# Patient Record
Sex: Male | Born: 1940 | Race: White | Hispanic: No | Marital: Married | Smoking: Former smoker
Health system: Southern US, Community
[De-identification: ages and names within clinical notes are randomized; demographics above are authoritative.]

## PROBLEM LIST (undated history)

## (undated) DIAGNOSIS — K219 Gastro-esophageal reflux disease without esophagitis: Secondary | ICD-10-CM

## (undated) DIAGNOSIS — R0602 Shortness of breath: Secondary | ICD-10-CM

## (undated) DIAGNOSIS — R002 Palpitations: Secondary | ICD-10-CM

## (undated) DIAGNOSIS — R351 Nocturia: Secondary | ICD-10-CM

## (undated) DIAGNOSIS — I351 Nonrheumatic aortic (valve) insufficiency: Secondary | ICD-10-CM

## (undated) DIAGNOSIS — I2510 Atherosclerotic heart disease of native coronary artery without angina pectoris: Secondary | ICD-10-CM

## (undated) DIAGNOSIS — M89669 Osteopathy after poliomyelitis, unspecified lower leg: Secondary | ICD-10-CM

## (undated) DIAGNOSIS — I517 Cardiomegaly: Secondary | ICD-10-CM

## (undated) DIAGNOSIS — B91 Sequelae of poliomyelitis: Secondary | ICD-10-CM

## (undated) DIAGNOSIS — R112 Nausea with vomiting, unspecified: Secondary | ICD-10-CM

## (undated) DIAGNOSIS — Z953 Presence of xenogenic heart valve: Secondary | ICD-10-CM

## (undated) DIAGNOSIS — I340 Nonrheumatic mitral (valve) insufficiency: Secondary | ICD-10-CM

## (undated) DIAGNOSIS — G7000 Myasthenia gravis without (acute) exacerbation: Secondary | ICD-10-CM

## (undated) DIAGNOSIS — I10 Essential (primary) hypertension: Secondary | ICD-10-CM

## (undated) DIAGNOSIS — N401 Enlarged prostate with lower urinary tract symptoms: Secondary | ICD-10-CM

## (undated) DIAGNOSIS — I350 Nonrheumatic aortic (valve) stenosis: Secondary | ICD-10-CM

## (undated) DIAGNOSIS — Z9889 Other specified postprocedural states: Secondary | ICD-10-CM

## (undated) DIAGNOSIS — Z951 Presence of aortocoronary bypass graft: Secondary | ICD-10-CM

## (undated) HISTORY — DX: Nonrheumatic mitral (valve) insufficiency: I34.0

## (undated) HISTORY — DX: Essential (primary) hypertension: I10

## (undated) HISTORY — DX: Myasthenia gravis without (acute) exacerbation: G70.00

## (undated) HISTORY — DX: Sequelae of poliomyelitis: B91

## (undated) HISTORY — DX: Nonrheumatic aortic (valve) insufficiency: I35.1

## (undated) HISTORY — DX: Nocturia: R35.1

## (undated) HISTORY — PX: CATARACT EXTRACTION: SUR2

## (undated) HISTORY — DX: Palpitations: R00.2

## (undated) HISTORY — DX: Osteopathy after poliomyelitis, unspecified lower leg: M89.669

## (undated) HISTORY — PX: CARDIAC CATHETERIZATION: SHX172

## (undated) HISTORY — DX: Benign prostatic hyperplasia with lower urinary tract symptoms: N40.1

## (undated) HISTORY — DX: Cardiomegaly: I51.7

## (undated) HISTORY — DX: Nonrheumatic aortic (valve) stenosis: I35.0

---

## 2006-07-12 ENCOUNTER — Other Ambulatory Visit: Payer: Self-pay

## 2006-07-12 ENCOUNTER — Ambulatory Visit: Payer: Self-pay | Admitting: Ophthalmology

## 2006-07-20 ENCOUNTER — Ambulatory Visit: Payer: Self-pay | Admitting: Ophthalmology

## 2008-12-14 ENCOUNTER — Ambulatory Visit: Payer: Self-pay | Admitting: Ophthalmology

## 2008-12-25 ENCOUNTER — Ambulatory Visit: Payer: Self-pay | Admitting: Ophthalmology

## 2010-09-24 ENCOUNTER — Telehealth: Payer: Self-pay | Admitting: Cardiovascular Disease

## 2010-09-24 NOTE — Telephone Encounter (Signed)
Please call patient and let him know if records have been received for his visit on Friday.  Call his cell number at [phone]

## 2010-09-24 NOTE — Telephone Encounter (Signed)
Pt contacted, no MR found, he has echo, ekg and labs in hand. He will see if dr will fax office notes.

## 2010-09-26 ENCOUNTER — Ambulatory Visit (INDEPENDENT_AMBULATORY_CARE_PROVIDER_SITE_OTHER): Payer: Medicare Other | Admitting: Cardiovascular Disease

## 2010-09-26 ENCOUNTER — Encounter: Payer: Self-pay | Admitting: Cardiovascular Disease

## 2010-09-26 VITALS — BP 142/96 | HR 96 | Ht 68.0 in | Wt 187.6 lb

## 2010-09-26 DIAGNOSIS — I359 Nonrheumatic aortic valve disorder, unspecified: Secondary | ICD-10-CM

## 2010-09-26 DIAGNOSIS — I350 Nonrheumatic aortic (valve) stenosis: Secondary | ICD-10-CM | POA: Insufficient documentation

## 2010-09-26 NOTE — Progress Notes (Signed)
Stephen Mendoza Date of Birth  [birth year] [facility] / [facility] [address]     [address] [place], [place]  [address] [phone]  Fax  [phone]  History of Present Illness:  70 yo with a hx of Aortic stenosis.  He is seen today for a second opinion.  He presents today with symptoms of shortness of breath particularly with exercise. He would feel like he is breathing in cold air. He also noted episodes of shortness breath when he was digging post holes.  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  He's never had any syncope. He denies any chest pain. He states that he does not have the shortness breath all the time he exercises but doesn't frequently.  Current Outpatient Prescriptions  Medication Sig Dispense Refill  . Ascorbic Acid (VITAMIN C) 1000 MG tablet Take 1,000 mg by mouth daily.        Marland Kitchen aspirin 81 MG tablet Take 81 mg by mouth daily.        . Coenzyme Q10 (CO Q 10) 100 MG CAPS Take 100 mg by mouth daily.        Marland Kitchen GLYCINE PO Take by mouth. Glycine Propinyl-L-Carnitine HC1,  Taking 1,000 mg Daily       . losartan-hydrochlorothiazide (HYZAAR) 100-25 MG per tablet Take 1 tablet by mouth daily.        . Multiple Vitamin (MULTIVITAMIN) capsule Take 1 capsule by mouth daily.        . Omega-3 Fatty Acids (FISH OIL PO) Take 1,600 mg by mouth 2 (two) times daily.           No Known Allergies  Past Medical History  Diagnosis Date  . Hypertension   Aortic Stenosis  No past surgical history on file.  History  Smoking status  . Never Smoker   Smokeless tobacco  . Not on file    History  Alcohol Use: Not on file    No family history on file.  Reviw of Systems:  Reviewed in the HPI.  All other systems are negative.  Physical Exam: BP 142/96  Pulse 96  Ht 5\' 8"  (1.727 m)  Wt 187 lb 9.6 oz (85.095 kg)  BMI 28.52 kg/m2 The patient is alert and oriented x 3.  The mood and affect are normal.     Skin: warm and dry.  Color is normal.    HEENT:   the sclera are nonicteric.  The mucous membranes are moist.  The carotids are 2+ without bruits. There is radiation of the systolic murmur up into the carotids. There is no thyromegaly.  There is no JVD.    Lungs: clear.  The chest wall is non tender.    Heart: regular rate with a normal S1 and S2.  There is a 2-3/6 systolic murmur. The PMI is not displaced.     Abdomen: good bowel sounds.  There is no guarding or rebound.  There is no hepatosplenomegaly or tenderness.  There are no masses.   Extremities:  no clubbing, cyanosis, or edema.  The legs are without rashes.  The distal pulses are somewhat diminished consistent with aortic stenosis  Neuro:  Cranial nerves II - XII are intact.  Motor and sensory functions are intact.    The gait is normal.  ECG: Sinus bradycardia. 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

## 2010-09-26 NOTE — Assessment & Plan Note (Addendum)
I have had a long discussion with Stephen Mendoza and his wife. It is clear that he will need an aortic valve replacement. He certainly will also need a cardiac catheterization. His peak aortic valve gradient is around 99 mmHg with a mean aortic valve gradient of 53 mmHg  Fortunately he's not having any severe chest pain or syncope. He does have some shortness of breath. I've asked him to go ahead and try to find a good time to have his aortic valve replaced. He's to call me sooner she knows that day. We will set up a cardiac catheterization a week or so before that time. We'll also need to get a recording of the echocardiogram to get a good look at his aortic valve. If the aortic valve is truly anastomotic, I do not think that we need to cross the aortic valve during the heart catheterization.

## 2010-11-13 ENCOUNTER — Ambulatory Visit: Payer: Medicare Other | Admitting: Cardiovascular Disease

## 2010-11-28 ENCOUNTER — Ambulatory Visit (INDEPENDENT_AMBULATORY_CARE_PROVIDER_SITE_OTHER): Payer: Medicare Other | Admitting: Cardiovascular Disease

## 2010-11-28 ENCOUNTER — Encounter: Payer: Self-pay | Admitting: Cardiovascular Disease

## 2010-11-28 VITALS — BP 160/87 | HR 73 | Ht 68.0 in | Wt 184.8 lb

## 2010-11-28 DIAGNOSIS — I359 Nonrheumatic aortic valve disorder, unspecified: Secondary | ICD-10-CM

## 2010-11-28 DIAGNOSIS — I350 Nonrheumatic aortic (valve) stenosis: Secondary | ICD-10-CM

## 2010-11-28 NOTE — Progress Notes (Signed)
Stephen Mendoza Date of Birth  [birth year] [facility] [address]    [address] [place], [place]  [address] [phone]  Fax  [phone]  History of Present Illness:  Stephen Mendoza is a 70 y.o. gentleman with a Hx of severe aortic stenosis.  Last last saw him I have recommended that we proceed with cardiac catheterization at some point. He was asymptomatic at the time and we were going to review the actual echocardiogram. He requested that the echo disc be sent to [place] but it  never arrived. We have moved locations so there is a possibility that it went to our old office.  He remains asymptomatic. 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 Specifically he denies any chest pain, shortness breath, or syncope.  Current Outpatient Prescriptions on File Prior to Visit  Medication Sig Dispense Refill  . aspirin 81 MG tablet Take 81 mg by mouth daily.        . Coenzyme Q10 (CO Q 10) 100 MG CAPS Take 100 mg by mouth daily.        Marland Kitchen losartan-hydrochlorothiazide (HYZAAR) 100-25 MG per tablet Take 1 tablet by mouth daily.        . Multiple Vitamin (MULTIVITAMIN) capsule Take 1 capsule by mouth daily.        . Omega-3 Fatty Acids (FISH OIL PO) Take 1,600 mg by mouth 2 (two) times daily.          No Known Allergies  Past Medical History  Diagnosis Date  . Hypertension     Essential  . Aortic stenosis     severe  . Palpitations   . Chest pain   . Aortic regurgitation   . Mitral regurgitation     Mild moderate  . Left atrial enlargement     No past surgical history on file.  History  Smoking status  . Former Smoker  . Quit date: 10/24/1970  Smokeless tobacco  . Not on file    History  Alcohol Use No    Family History  Problem Relation Age of Onset  . Heart attack Father     Reviw of Systems:  Reviewed in the HPI.  All other systems are negative.  Physical Exam: BP 160/87   Pulse 73  Ht 5\' 8"  (1.727 m)  Wt 184 lb 12.8 oz (83.825 kg)  BMI 28.10 kg/m2 The patient is alert and oriented x 3.  The mood and affect are normal.   Skin: warm and dry.  Color is normal.    HEENT:   His carotids are 1-2+ and are slightly delayed. He has a radiated murmur into his carotid arteries. His neck is supple  Lungs: Lungs are clear.    Heart: Regular rate S1-S2. He has a 3/6 systolic murmur.  His PMI is nondisplaced.  Abdomen: He has good bowel sounds. There is no hepatosplenomegaly.  Extremities:  He has no clubbing cyanosis or edema. His peripheral pulses are somewhat diminished but are still full.  Neuro:  Nursing is non-focal. His gait is normal.     Assessment / Plan:

## 2010-11-28 NOTE — Assessment & Plan Note (Signed)
Stephen Mendoza we'll try one more time to have his echocardiogram sent to [place]. At this point he remains asymptomatic. I've told him about the 3 significant symptoms with aortic stenosis including chest pain, shortness of breath, and syncope. He will call us if he develops any B. symptoms. Otherwise I'll see him in 6 months.

## 2010-11-28 NOTE — Patient Instructions (Signed)
Your physician wants you to follow-up in: 6 MONTH You will receive a reminder letter in the mail two months in advance. If you don't receive a letter, please call our office to schedule the follow-up appointment.  BRING ACTUAL CD OF YOUR ECHO TO YOUR NEXT OFFICE VISIT.  WATCH FOR SYMPTOMS OF AORTIC STENOSIS CHEST PAIN SHORTNESS OF BREATH SYNCOPE (PASSING OUT)

## 2011-02-25 ENCOUNTER — Encounter: Payer: Self-pay | Admitting: Cardiovascular Disease

## 2011-05-28 ENCOUNTER — Other Ambulatory Visit: Payer: Self-pay

## 2011-05-28 ENCOUNTER — Ambulatory Visit (HOSPITAL_COMMUNITY): Payer: Medicare Other | Attending: Cardiology

## 2011-05-28 ENCOUNTER — Encounter: Payer: Self-pay | Admitting: Cardiovascular Disease

## 2011-05-28 ENCOUNTER — Ambulatory Visit (INDEPENDENT_AMBULATORY_CARE_PROVIDER_SITE_OTHER): Payer: Medicare Other | Admitting: Cardiovascular Disease

## 2011-05-28 VITALS — BP 155/82 | HR 67 | Ht 68.0 in | Wt 175.0 lb

## 2011-05-28 DIAGNOSIS — I359 Nonrheumatic aortic valve disorder, unspecified: Secondary | ICD-10-CM

## 2011-05-28 DIAGNOSIS — I10 Essential (primary) hypertension: Secondary | ICD-10-CM | POA: Insufficient documentation

## 2011-05-28 DIAGNOSIS — I350 Nonrheumatic aortic (valve) stenosis: Secondary | ICD-10-CM

## 2011-05-28 NOTE — Patient Instructions (Signed)
Your physician recommends that you schedule a follow-up appointment in: 1 month  Your physician has requested that you have an echocardiogram. Echocardiography is a painless test that uses sound waves to create images of your heart. It provides your doctor with information about the size and shape of your heart and how well your heart's chambers and valves are working. This procedure takes approximately one hour. There are no restrictions for this procedure.  Your physician has recommended you make the following change in your medication:  STOP LISINOPRIL/ DUPLICATE MED

## 2011-05-28 NOTE — Progress Notes (Signed)
  Stephen Mendoza Date of Birth  12/09/1940       [place] [facility]    [place] [facility] [address], [address]  [address] [place], [place]  [address]   [place], [place]  [address] [phone]     [phone]   Fax  [phone]    Fax [phone]  Problem List: 1. Aortic stenosis   History of Present Illness: Stephen Mendoza is a 71 y.o. gentleman with a Hx of severe aortic stenosis. Last last saw him I have recommended that we proceed with cardiac catheterization at some point. He was asymptomatic at the time and we were going to review the actual echocardiogram. He requested that the echo disc be sent to us but it never arrived. We have moved locations so there is a possibility that it went to our old office.  He remains asymptomatic. 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 Specifically he denies any chest pain, shortness breath, or syncope.  It is clear that he will need an aortic valve replacement. He certainly will also need a cardiac catheterization. His peak aortic valve gradient is around 99 mmHg with a mean aortic valve gradient of 53 mmHg.  He was started on lisinopril by his medical doctor. He was already on losartan. He questioned his doctor at that time but went ahead and took the medication. He brought with him his blood pressure log. His blood pressure readings are mildly elevated. His typical reading is around 140/75. He thinks that the lisinopril is also causing constipation.   Current Outpatient Prescriptions on File Prior to Visit  Medication Sig Dispense Refill  . aspirin 81 MG tablet Take 81 mg by mouth 2 (two) times daily.       . Coenzyme Q10 (CO Q 10) 100 MG CAPS Take 100 mg by mouth 2 (two) times daily.       . lisinopril (PRINIVIL,ZESTRIL) 10 MG tablet Take 10 mg by mouth daily.      . losartan-hydrochlorothiazide (HYZAAR) 100-25 MG per tablet Take 1  tablet by mouth daily.        . Multiple Vitamin (MULTIVITAMIN) capsule Take 1 capsule by mouth daily.        . Omega-3 Fatty Acids (FISH OIL PO) Take 1,600 mg by mouth 2 (two) times daily.          No Known Allergies  Past Medical History  Diagnosis Date  . Hypertension     Essential  . Aortic stenosis     severe  . Palpitations   . Chest pain   . Aortic regurgitation   . Mitral regurgitation     Mild moderate  . Left atrial enlargement     No past surgical history on file.  History  Smoking status  . Former Smoker  . Quit date: 10/24/1970  Smokeless tobacco  . Not on file    History  Alcohol Use No    Family History  Problem Relation Age of Onset  . Heart attack Father     Reviw of Systems:  Reviewed in the HPI.  All other systems are negative.  Physical Exam: Blood pressure 155/82, pulse 67, height 5' 8" (1.727 m), weight 175 lb (79.379 kg). General: Well developed, well nourished, in no acute distress.  Head: Normocephalic, atraumatic, sclera non-icteric, mucus membranes are moist,   Neck: Supple. Carotids are 2 + without bruits.   No JVD.  There is radiation of his systolic murmur. Lungs: Clear bilaterally to auscultation.  Heart: regular rate.  normal  S1 S2. There is a 3/6 crescendo decrescendo systolic murmur.  He said the upper right sternal border and into the carotids.  Abdomen: Soft, non-tender, non-distended with normal bowel sounds. No hepatomegaly. No rebound/guarding. No masses.  Msk:  Strength and tone are normal  Extremities: No clubbing or cyanosis. No edema.  Distal pedal pulses are 1+ and equal bilaterally.  Neuro: Alert and oriented X 3. Moves all extremities spontaneously.  Psych:  Responds to questions appropriately with a normal affect.  ECG:  Assessment / Plan:   

## 2011-05-28 NOTE — Assessment & Plan Note (Signed)
Stephen Mendoza has critical aortic stenosis. We will repeat the echocardiogram. His last echo revealed a mean aortic valve gradient of 53. I suspect that he will need to have his aortic valve replaced. Surprisingly, he is basically asymptomatic.  This summer would be a good time for him to have an aortic valve replacement according to his schedule.  He would like to have a minithoracotomy approach if possible.  We will get the echocardiogram and make further arrangements. I'll see him again in one month.

## 2011-06-02 ENCOUNTER — Telehealth: Payer: Self-pay | Admitting: Cardiovascular Disease

## 2011-06-02 NOTE — Telephone Encounter (Signed)
Pt calling re scheduling a catherization °

## 2011-06-04 ENCOUNTER — Telehealth: Payer: Self-pay | Admitting: *Deleted

## 2011-06-04 ENCOUNTER — Encounter: Payer: Self-pay | Admitting: *Deleted

## 2011-06-04 DIAGNOSIS — I350 Nonrheumatic aortic (valve) stenosis: Secondary | ICD-10-CM

## 2011-06-04 DIAGNOSIS — Z01818 Encounter for other preprocedural examination: Secondary | ICD-10-CM

## 2011-06-04 NOTE — Telephone Encounter (Signed)
Pre o[p lab work set, see letter.

## 2011-06-11 ENCOUNTER — Other Ambulatory Visit (INDEPENDENT_AMBULATORY_CARE_PROVIDER_SITE_OTHER): Payer: Medicare Other

## 2011-06-11 ENCOUNTER — Other Ambulatory Visit: Payer: Self-pay | Admitting: Cardiovascular Disease

## 2011-06-11 ENCOUNTER — Other Ambulatory Visit: Payer: Self-pay | Admitting: *Deleted

## 2011-06-11 ENCOUNTER — Telehealth: Payer: Self-pay | Admitting: *Deleted

## 2011-06-11 DIAGNOSIS — Z01818 Encounter for other preprocedural examination: Secondary | ICD-10-CM

## 2011-06-11 DIAGNOSIS — I359 Nonrheumatic aortic valve disorder, unspecified: Secondary | ICD-10-CM

## 2011-06-11 DIAGNOSIS — I350 Nonrheumatic aortic (valve) stenosis: Secondary | ICD-10-CM

## 2011-06-11 LAB — BASIC METABOLIC PANEL
CO2: 33 mEq/L — ABNORMAL HIGH (ref 19–32)
Calcium: 9.1 mg/dL (ref 8.4–10.5)
Creatinine, Ser: 0.7 mg/dL (ref 0.4–1.5)
Sodium: 144 mEq/L (ref 135–145)

## 2011-06-11 LAB — CBC WITH DIFFERENTIAL/PLATELET
Basophils Relative: 1 % (ref 0.0–3.0)
Eosinophils Relative: 3.8 % (ref 0.0–5.0)
Hemoglobin: 12.9 g/dL — ABNORMAL LOW (ref 13.0–17.0)
Lymphocytes Relative: 35.7 % (ref 12.0–46.0)
MCHC: 33.4 g/dL (ref 30.0–36.0)
Monocytes Relative: 8.8 % (ref 3.0–12.0)
Neutro Abs: 2.6 10*3/uL (ref 1.4–7.7)
Neutrophils Relative %: 50.7 % (ref 43.0–77.0)
RBC: 4.15 Mil/uL — ABNORMAL LOW (ref 4.22–5.81)
WBC: 5.1 10*3/uL (ref 4.5–10.5)

## 2011-06-11 LAB — PROTIME-INR: Prothrombin Time: 10.8 s (ref 10.2–12.4)

## 2011-06-11 LAB — APTT: aPTT: 28.4 s (ref 21.7–28.8)

## 2011-06-11 MED ORDER — AMLODIPINE BESYLATE 5 MG PO TABS: 5.0000 mg | ORAL_TABLET | Freq: Every day | ORAL | Status: DC

## 2011-06-11 NOTE — Telephone Encounter (Signed)
SEEN TODAY FOR LABS/ PAPERWORK GIVEN FOR L/RHC, DR NAHSER REVIEWED BP RESULTS AND STARTED NEW MED FOR HTN

## 2011-06-15 ENCOUNTER — Encounter (HOSPITAL_BASED_OUTPATIENT_CLINIC_OR_DEPARTMENT_OTHER)
Admission: RE | Disposition: A | Discharge status: Home / Self Care | Payer: Self-pay | Source: Ambulatory Visit | Attending: Cardiovascular Disease

## 2011-06-15 ENCOUNTER — Inpatient Hospital Stay (HOSPITAL_BASED_OUTPATIENT_CLINIC_OR_DEPARTMENT_OTHER)
Admission: RE | Admit: 2011-06-15 | Discharge: 2011-06-15 | Disposition: A | Discharge status: Home / Self Care | Payer: Medicare Other | Source: Ambulatory Visit | Attending: Cardiovascular Disease | Admitting: Cardiovascular Disease

## 2011-06-15 DIAGNOSIS — R0609 Other forms of dyspnea: Secondary | ICD-10-CM | POA: Insufficient documentation

## 2011-06-15 DIAGNOSIS — I359 Nonrheumatic aortic valve disorder, unspecified: Secondary | ICD-10-CM | POA: Insufficient documentation

## 2011-06-15 DIAGNOSIS — R0989 Other specified symptoms and signs involving the circulatory and respiratory systems: Secondary | ICD-10-CM | POA: Insufficient documentation

## 2011-06-15 DIAGNOSIS — I2510 Atherosclerotic heart disease of native coronary artery without angina pectoris: Secondary | ICD-10-CM

## 2011-06-15 LAB — POCT I-STAT 3, VENOUS BLOOD GAS (G3P V)
Acid-Base Excess: 4 mmol/L — ABNORMAL HIGH (ref 0.0–2.0)
Bicarbonate: 29.1 mEq/L — ABNORMAL HIGH (ref 20.0–24.0)
Bicarbonate: 30.1 mEq/L — ABNORMAL HIGH (ref 20.0–24.0)
O2 Saturation: 93 %
TCO2: 31 mmol/L (ref 0–100)
pCO2, Ven: 49.3 mmHg (ref 45.0–50.0)
pCO2, Ven: 51.7 mmHg — ABNORMAL HIGH (ref 45.0–50.0)
pO2, Ven: 38 mmHg (ref 30.0–45.0)
pO2, Ven: 70 mmHg — ABNORMAL HIGH (ref 30.0–45.0)

## 2011-06-15 LAB — POCT I-STAT 3, ART BLOOD GAS (G3+)
Bicarbonate: 30 mEq/L — ABNORMAL HIGH (ref 20.0–24.0)
pH, Arterial: 7.396 (ref 7.350–7.450)

## 2011-06-15 SURGERY — JV LEFT AND RIGHT HEART CATHETERIZATION WITH CORONARY ANGIOGRAM
Anesthesia: Moderate Sedation

## 2011-06-15 MED ORDER — SODIUM CHLORIDE 0.9 % IJ SOLN: 3.0000 mL | Freq: Two times a day (BID) | INTRAMUSCULAR | Status: DC

## 2011-06-15 MED ORDER — ACETAMINOPHEN 325 MG PO TABS: 650.0000 mg | ORAL_TABLET | ORAL | Status: DC | PRN

## 2011-06-15 MED ORDER — ASPIRIN 81 MG PO CHEW: 324.0000 mg | CHEWABLE_TABLET | ORAL | Status: DC

## 2011-06-15 MED ORDER — DIAZEPAM 5 MG PO TABS
5.0000 mg | ORAL_TABLET | ORAL | Status: AC
  Administered 2011-06-15: 5 mg via ORAL

## 2011-06-15 MED ORDER — ONDANSETRON HCL 4 MG/2ML IJ SOLN: 4.0000 mg | Freq: Four times a day (QID) | INTRAMUSCULAR | Status: DC | PRN

## 2011-06-15 MED ORDER — SODIUM CHLORIDE 0.9 % IV SOLN: 250.0000 mL | INTRAVENOUS | Status: DC | PRN

## 2011-06-15 MED ORDER — SODIUM CHLORIDE 0.9 % IJ SOLN: 3.0000 mL | INTRAMUSCULAR | Status: DC | PRN

## 2011-06-15 MED ORDER — SODIUM CHLORIDE 0.9 % IV SOLN
INTRAVENOUS | Status: DC
  Administered 2011-06-15: 11:00:00 via INTRAVENOUS

## 2011-06-15 NOTE — Interval H&P Note (Signed)
History and Physical Interval Note:  06/15/2011 12:33 PM  Stephen Mendoza  has presented today for surgery, with the diagnosis of cp  The various methods of treatment have been discussed with the patient and family. After consideration of risks, benefits and other options for treatment, the patient has consented to  Procedure(s) (LRB): JV LEFT AND RIGHT HEART CATHETERIZATION WITH CORONARY ANGIOGRAM (N/A) as a surgical intervention .  The patients' history has been reviewed, patient examined, no change in status, stable for surgery.  I have reviewed the patients' chart and labs.  Questions were answered to the patient's satisfaction.     Elyn Aquas.

## 2011-06-15 NOTE — H&P (View-Only) (Signed)
[name] Date of Birth  May 02, 1940       [facility]    [place] [address]  [address] [place], [place]  [address]   [place], [place]  [address] [phone]     [phone]   Fax  [phone]    Fax [phone]  Problem List: 1. Aortic stenosis   History of Present Illness: Waleed is a 71 y.o. gentleman with a Hx of severe aortic stenosis. Last last saw him I have recommended that we proceed with cardiac catheterization at some point. He was asymptomatic at the time and we were going to review the actual echocardiogram. He requested that the echo disc be sent to [place] but it never arrived. We have moved locations so there is a possibility that it went to our old office.  He remains asymptomatic. 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 Specifically he denies any chest pain, shortness breath, or syncope.  It is clear that he will need an aortic valve replacement. He certainly will also need a cardiac catheterization. His peak aortic valve gradient is around 99 mmHg with a mean aortic valve gradient of 53 mmHg.  He was started on lisinopril by his medical doctor. He was already on losartan. He questioned his doctor at that time but went ahead and took the medication. He brought with him his blood pressure log. His blood pressure readings are mildly elevated. His typical reading is around 140/75. He thinks that the lisinopril is also causing constipation.   Current Outpatient Prescriptions on File Prior to Visit  Medication Sig Dispense Refill  . aspirin 81 MG tablet Take 81 mg by mouth 2 (two) times daily.       . Coenzyme Q10 (CO Q 10) 100 MG CAPS Take 100 mg by mouth 2 (two) times daily.       Marland Kitchen lisinopril (PRINIVIL,ZESTRIL) 10 MG tablet Take 10 mg by mouth daily.      Marland Kitchen losartan-hydrochlorothiazide (HYZAAR) 100-25 MG per tablet Take 1  tablet by mouth daily.        . Multiple Vitamin (MULTIVITAMIN) capsule Take 1 capsule by mouth daily.        . Omega-3 Fatty Acids (FISH OIL PO) Take 1,600 mg by mouth 2 (two) times daily.          No Known Allergies  Past Medical History  Diagnosis Date  . Hypertension     Essential  . Aortic stenosis     severe  . Palpitations   . Chest pain   . Aortic regurgitation   . Mitral regurgitation     Mild moderate  . Left atrial enlargement     No past surgical history on file.  History  Smoking status  . Former Smoker  . Quit date: 10/24/1970  Smokeless tobacco  . Not on file    History  Alcohol Use No    Family History  Problem Relation Age of Onset  . Heart attack Father     Reviw of Systems:  Reviewed in the HPI.  All other systems are negative.  Physical Exam: Blood pressure 155/82, pulse 67, height 5\' 8"  (1.727 m), weight 175 lb (79.379 kg). General: Well developed, well nourished, in no acute distress.  Head: Normocephalic, atraumatic, sclera non-icteric, mucus membranes are moist,   Neck: Supple. Carotids are 2 + without bruits.  No JVD.  There is radiation of his systolic murmur. Lungs: Clear bilaterally to auscultation.  Heart: regular rate.  normal  S1 S2. There is a 3/6 crescendo decrescendo systolic murmur.  He said the upper right sternal border and into the carotids.  Abdomen: Soft, non-tender, non-distended with normal bowel sounds. No hepatomegaly. No rebound/guarding. No masses.  Msk:  Strength and tone are normal  Extremities: No clubbing or cyanosis. No edema.  Distal pedal pulses are 1+ and equal bilaterally.  Neuro: Alert and oriented X 3. Moves all extremities spontaneously.  Psych:  Responds to questions appropriately with a normal affect.  ECG:  Assessment / Plan:

## 2011-06-15 NOTE — Progress Notes (Signed)
Bedrest begins @ 1145.

## 2011-06-15 NOTE — Progress Notes (Signed)
Voided 300cc clear yellow urine

## 2011-06-15 NOTE — CV Procedure (Signed)
    Cardiac Cath Note  Stephen Mendoza [id number] 02/21/1940  Procedure: Right and Left Heart Cardiac Catheterization Note Indications: Aortic stenosis, dyspnea  Procedure Details Consent: Obtained Time Out: Verified patient identification, verified procedure, site/side was marked, verified correct patient position, special equipment/implants available, Radiology Safety Procedures followed,  medications/allergies/relevent history reviewed, required imaging and test results available.  Performed   Medications: Fentanyl: 50 mcg IV Versed: 2 mg IV  The right femoral artery and right femoral vein were easily canulated using a modified Seldinger technique.  Hemodynamics:   RA: 10/8/6 RV: 33/4/7 PCWP: 17/15/13 PA:  31/14/20  Cardiac Output   Thermodilution: 4.6 lpm, index of 2.4  Fick : 4.9 lpm , index of 2.5  Arterial Sat: 93 PA Sat: 65.  LV pressure: no obtained, did not cross the aortic valve Aortic pressure: 141/75/101  Angiography   Left Main: smooth and normal  Left anterior Descending: The LAD gives off an early 1st diagonal.  This 1st diagonal has a long diffuse 60-70% stenosis.  The mid LAD has a 50-60 segmental stenosis in the proximal segment.  The 2nd diagonal has a 20% stenosis at it's takeoff.  The distal LAD has a 20-30 % stenosis.  Left Circumflex: The LCx is a moderate - large vessel.  It gives off several moderate sized marginals that are fairly normal.  Right Coronary Artery: Large and dominant.  The prox / mid vessel has a long lesion that ranges from 50-60 % in severity.  The distal RCA has minor luminal irregularities.  The PDA and PLSA are normal.  LV Gram: no LV gram was performed  Complications: No apparent complications Patient did tolerate procedure well.  Conclusions:   1. 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 Known severe Aortic stenosis.  Will refer to the cardiac surgeons.  He will need CABG to LAD system and  RCA with his AVR.  Stephen Mendoza, Stephen Mendoza., MD, [facility] 06/15/2011, 11:33 AM Office - [phone] Pager [phone]

## 2011-06-16 ENCOUNTER — Telehealth: Payer: Self-pay | Admitting: Cardiovascular Disease

## 2011-06-16 NOTE — Telephone Encounter (Signed)
No tenderness/ swelling/ redness. Pt has some latent bruising 1 inch around access site. Told all signs were normal and described what to watch for and to call back with any questions or concerns, pt agreed to plan.

## 2011-06-16 NOTE — Telephone Encounter (Signed)
Pt hd heart cath yesterday and some discoloration at the site wants to discuss

## 2011-06-17 ENCOUNTER — Encounter: Payer: Medicare Other | Admitting: Thoracic Surgery (Cardiothoracic Vascular Surgery)

## 2011-06-18 ENCOUNTER — Encounter: Payer: Medicare Other | Admitting: Thoracic Surgery (Cardiothoracic Vascular Surgery)

## 2011-06-19 ENCOUNTER — Institutional Professional Consult (permissible substitution) (INDEPENDENT_AMBULATORY_CARE_PROVIDER_SITE_OTHER): Payer: Medicare Other | Admitting: Thoracic Surgery (Cardiothoracic Vascular Surgery)

## 2011-06-19 ENCOUNTER — Encounter: Payer: Self-pay | Admitting: Thoracic Surgery (Cardiothoracic Vascular Surgery)

## 2011-06-19 ENCOUNTER — Encounter: Payer: Medicare Other | Admitting: Thoracic Surgery (Cardiothoracic Vascular Surgery)

## 2011-06-19 VITALS — BP 157/87 | HR 75 | Resp 20 | Ht 68.0 in | Wt 173.0 lb

## 2011-06-19 DIAGNOSIS — R351 Nocturia: Secondary | ICD-10-CM

## 2011-06-19 DIAGNOSIS — N401 Enlarged prostate with lower urinary tract symptoms: Secondary | ICD-10-CM

## 2011-06-19 DIAGNOSIS — I359 Nonrheumatic aortic valve disorder, unspecified: Secondary | ICD-10-CM

## 2011-06-19 DIAGNOSIS — I350 Nonrheumatic aortic (valve) stenosis: Secondary | ICD-10-CM | POA: Insufficient documentation

## 2011-06-19 DIAGNOSIS — N138 Other obstructive and reflux uropathy: Secondary | ICD-10-CM

## 2011-06-19 DIAGNOSIS — I2510 Atherosclerotic heart disease of native coronary artery without angina pectoris: Secondary | ICD-10-CM | POA: Insufficient documentation

## 2011-06-19 NOTE — Progress Notes (Signed)
[address]            [name] [address]          [phone]     CARDIOTHORACIC SURGERY CONSULTATION REPORT  Referring Provider is Nahser, Deloris Ping, MD PCP is Judge Stall, MD, MD  Chief Complaint  Patient presents with  . Aortic Stenosis    referral from Dr Elease Hashimoto for eval on aortic stenosis and coronary artery disease, cardiac cath 06/15/11  . 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  The patient states that he was first told he had a heart murmur or than 40 years ago. He was first diagnosed with aortic stenosis approximately 4 years ago when his primary care physician referred him to a cardiologist in [place]. An echocardiogram was performed and the patient was told that he would someday require aortic valve replacement. At that time he remained asymptomatic.  2 years ago he first began to experience occasional episodes of chest discomfort associated with strenuous physical activity.  The patient describes these episodes as a feeling of cold sensation in his upper chest and throat, such as what might occur if one were to take a very deep breath of extremely cold air. He was seen at that time by a cardiologist in [place] and he was told that he should have aortic valve replacement surgery. He elected to hold off on any further evaluation at that time.  More recently the patient has developed progressive mild exertional shortness of breath and fatigue. Symptoms are always brought on with physical activity and relieved by rest. The patient's exercise tolerance is definitely decreased. He was referred to Dr. Elease Hashimoto and an echocardiogram was performed confirming the presence of severe aortic stenosis with normal left ventricular systolic function. The patient subsequently underwent left and right heart catheterization which also revealed the  presence of severe two-vessel coronary artery disease. The patient has now been referred to consider elective aortic valve replacement and coronary artery bypass grafting.   Past Medical History  Diagnosis Date  . Hypertension     Essential  . Aortic stenosis     severe  . Palpitations   . Chest pain   . Aortic regurgitation   . Mitral regurgitation     Mild moderate  . Left atrial enlargement     No past surgical history on file.  Family History  Problem Relation Age of Onset  . Heart attack Father     Social History History  Substance Use Topics  . Smoking status: Former Smoker    Quit date: 10/24/1970  . Smokeless tobacco: Not on file  . Alcohol Use: No    Current Outpatient Prescriptions  Medication Sig Dispense Refill  . amLODipine (NORVASC) 5 MG tablet Take 1 tablet (5 mg total) by mouth daily.  30 tablet  5  . aspirin 81 MG tablet Take 81 mg by mouth 2 (two) times daily.       . Coenzyme Q10 (CO Q 10) 100 MG CAPS Take 100 mg by mouth 2 (two) times daily.       Marland Kitchen GRAPE SEED EXTRACT PO Take by mouth daily.      Marland Kitchen HAWTHORN PO Take 300 mg by mouth 2 (two) times daily.      Marland Kitchen losartan-hydrochlorothiazide (HYZAAR) 100-25 MG  per tablet Take 1 tablet by mouth daily.        . Misc Natural Products (RED WINE EXTRACT PO) Take by mouth daily.      . Multiple Vitamin (MULTIVITAMIN) capsule Take 1 capsule by mouth daily.        . Omega-3 Fatty Acids (FISH OIL PO) Take 1,600 mg by mouth 2 (two) times daily.          No Known Allergies  Review of Systems:  General:  normal appetite, decreased energy   Respiratory:  no cough, no wheezing, no hemoptysis, no pain with inspiration or cough, + shortness of breath with exertion  Cardiac:   no chest pain or tightness, + exertional SOB, no resting SOB, no PND, no orthopnea, no LE edema, no palpitations, no syncope  GI:   + occasional mild difficulty swallowing, no hematochezia, no hematemesis, no melena, no constipation, no  diarrhea   GU:   no dysuria, no urgency, + frequency, + nocturia  Musculoskeletal: no arthritis, no arthralgia, slight limp related to right leg  Vascular:  no pain suggestive of claudication  Neuro:   no symptoms suggestive of TIA's, no seizures, no headaches, no peripheral neuropathy   Endocrine:  Negative   HEENT:  no loose teeth or painful teeth but hasn't seen a dentist in years,  no recent vision changes  Psych:   no anxiety, no depression    Physical Exam:   BP 157/87  Pulse 75  Resp 20  Ht 5\' 8"  (1.727 m)  Wt 173 lb (78.472 kg)  BMI 26.30 kg/m2  SpO2 98%  General:    well-appearing  HEENT:  Unremarkable   Neck:   no JVD, no bruits, no adenopathy   Chest:   clear to auscultation, symmetrical breath sounds, no wheezes, no rhonchi   CV:   RRR, grade III/VI systolic murmur   Abdomen:  soft, non-tender, no masses   Extremities:  warm, well-perfused, pulses diminished, no LE edema  Rectal/GU  Deferred  Neuro:   Grossly non-focal and symmetrical throughout  Skin:   Clean and dry, no rashes, no breakdown    Diagnostic Tests:  Transthoracic Echocardiography  Patient:    Stephen Mendoza, Stephen Mendoza MR #:       [id number] Study Date: 05/28/2011  ------------------------------------------------------------ LV EF: 60% -   65% ------------------------------------------------------------ Study Conclusions  - Left ventricle: The cavity size was normal. Wall thickness   was increased in a pattern of moderate LVH. Systolic   function was normal. The estimated ejection fraction was   in the range of 60% to 65%. Doppler parameters are   consistent with abnormal left ventricular relaxation   (grade 1 diastolic dysfunction). - Aortic valve: AV is difficult to see well. It is   thickened, calcified with restricted motion. Peak and mean   gradients through the valve are112 and 57 mm mm Hg   respectively consistent withcritical AS. Trivial   regurgitation. Mean gradient: 57mm Hg (S). Peak  gradient:   Hg (S). - Mitral valve: Mild regurgitation. - Pulmonary arteries: PA peak pressure: 34mm Hg (S). ---------------------------------- ------------------------------------------------------------ Left ventricle:  The cavity size was normal. Wall thickness was increased in a pattern of moderate LVH. Systolic function was normal. The estimated ejection fraction was in the range of 60% to 65%. Doppler parameters are consistent with abnormal left ventricular relaxation (grade 1 diastolic dysfunction). ------------------------------------------------------------ Aortic valve:  AV is difficult to see well. It is thickened, calcified with restricted motion. Peak and  mean gradients through the valve are112 and 57 mm mm Hg respectively consistent withcritical AS.  Doppler:   Trivial regurgitation.    VTI ratio of LVOT to aortic valve: 0.21. Valve area: 0.89cm^2(VTI). Indexed valve area: 0.44cm^2/m^2 (VTI). Peak velocity ratio of LVOT to aortic valve: 0.22. Valve area: 0.92cm^2 (Vmax). Indexed valve area: 0.46cm^2/m^2 (Vmax).    Mean gradient: 57mm Hg (S). Peak gradient: Hg (S). ------------------------------------------------------------ Mitral valve:   Mildly thickened leaflets .  Doppler:   Mild regurgitation.    Peak gradient: 2mm Hg (D). ------------------------------------------------------------ Left atrium:  The atrium was mildly dilated. ------------------------------------------------------------ Right ventricle:  The cavity size was normal. Wall thickness was normal. Systolic function was normal. ------------------------------------------------------------ Pulmonic valve:    Structurally normal valve.   Cusp separation was normal.  Doppler:  Transvalvular velocity was within the normal range.  No regurgitation. ------------------------------------------------------------ Tricuspid valve:   Structurally normal valve.   Leaflet separation was normal.  Doppler:   Transvalvular velocity was within the normal range.  No significant regurgitation.  ------------------------------------------------------------ Right atrium:  The atrium was normal in size. ------------------------------------------------------------ Pericardium:  There was no pericardial effusion. ------------------------------------------------------------ Systemic veins: Inferior vena cava: The vessel was normal in size; the respirophasic diameter changes were in the normal range (= 50%); findings are consistent with normal central venous pressure. ------------------------------------------------------------  2D measurements        Normal  Doppler measurements   Normal Left ventricle                 Main pulmonary LVID ED,   36.4 mm     43-52   artery chord,                         Pressure,    34 mm Hg  =30 PLAX                           S LVID ES,   17.7 mm     23-38   Left ventricle chord,                         Ea, lat    5.48 cm/s   ------ PLAX                           ann, tiss FS, chord,   51 %      >29     DP PLAX                           E/Ea, lat  13.8        ------ LVPW, ED   16.7 mm     ------  ann, tiss     7 IVS/LVPW   0.96        <1.3    DP ratio, ED                      Ea, med     5.7 cm/s   ------ Ventricular septum             ann, tiss IVS, ED    16.1 mm     ------  DP LVOT  E/Ea, med  13.3        ------ Diam, S      23 mm     ------  ann, tiss     3 Area       4.15 cm^2   ------  DP Diam         23 mm     ------  LVOT Aorta                          Peak vel,   117 cm/s   ------ Root diam,   30 mm     ------  S ED                             VTI, S     27.6 cm     ------ AAo AP       33 mm     ------  Peak          5 mm Hg  ------ diam, S                        gradient, Left atrium                    S AP dim       43 mm     ------  Stroke vol 114. ml     ------ AP dim     2.14 cm/m^2 <2.2                  7 index                           Stroke     57.1 ml/m^2 ------                                index                                Aortic valve                                Peak vel,   529 cm/s   ------                                S                                Mean vel,   346 cm/s   ------                                S                                VTI, S      129 cm     ------  Mean         57 mm Hg  ------                                gradient,                                S                                Peak        112 mm Hg  ------                                gradient,                                S                                VTI ratio  0.21        ------                                LVOT/AV                                Area, VTI  0.89 cm^2   ------                                Area index 0.44 cm^2/m ------                                (VTI)           ^2                                Peak vel   0.22        ------                                ratio,                                LVOT/AV                                Area, Vmax 0.92 cm^2   ------                                Area index 0.46 cm^2/m ------                                (Vmax)          ^  2                                Mitral valve                                Peak E vel   76 cm/s   ------                                Peak A vel 96.7 cm/s   ------                                Decelerati  197 ms     150-23                                on time                0                                Peak          2 mm Hg  ------                                gradient,                                D                                Peak E/A    0.8        ------                                ratio                                Tricuspid valve                                Regurg      271 cm/s   ------                                peak vel                                 Peak RV-RA   29 mm Hg  ------                                gradient,  S                                Systemic veins                                Estimated     5 mm Hg  ------                                CVP                                Right ventricle                                Pressure,    34 mm Hg  <30                                S                                Sa vel,    14.3 cm/s   ------                                lat ann,                                tiss DP   ------------------------------------------------------------ Prepared and Electronically Authenticated by  Dietrich Pates, MD 2013-05-16T17:15:23.517     Cardiac Cath Note  Stephen Mendoza [id number] 1940/01/17  Procedure: Right and Left Heart Cardiac Catheterization Note Indications: Aortic stenosis, dyspnea   Hemodynamics:    RA: 10/8/6 RV: 33/4/7 PCWP: 17/15/13 PA:  31/14/20  Cardiac Output              Thermodilution: 4.6 lpm, index of 2.4             Fick : 4.9 lpm , index of 2.5  Arterial Sat: 93 PA Sat: 65.  LV pressure: no obtained, did not cross the aortic valve Aortic pressure: 141/75/101  Angiography    Left Main: smooth and normal  Left anterior Descending: The LAD gives off an early 1st diagonal.  This 1st diagonal has a long diffuse 60-70% stenosis.  The mid LAD has a 50-60 segmental stenosis in the proximal segment.  The 2nd diagonal has a 20% stenosis at it's takeoff.  The distal LAD has a 20-30 % stenosis.  Left Circumflex: The LCx is a moderate - large vessel.  It gives off several moderate sized marginals that are fairly normal.  Right Coronary Artery: Large and dominant.  The prox / mid vessel has a long lesion that ranges from 50-60 % in severity.  The distal RCA has minor luminal irregularities.  The PDA and PLSA are normal.  LV Gram: no LV gram was performed    Impression:  The patient has severe  symptomatic aortic stenosis with severe two-vessel coronary artery disease and normal left ventricular systolic function. Patient has stable symptoms  of exertional shortness of breath and fatigue.  I agree that he needs aortic valve replacement and coronary artery bypass grafting.  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  He may require septal myomectomy at the time of aortic valve replacement. The patient otherwise is in remarkably good health and appears to be an excellent surgical candidate. He has not seen a dentist in several years although he does not report any ongoing symptoms worrisome for any sort of dental infection.   Plan:  The patient was counseled at length regarding surgical alternatives with respect to valve replacement. In particular, discussion was held comparing in contrast and the risks of mechanical valve replacement and the need for lifelong anticoagulation versus use of a bioprosthetic tissue valve and the associated potential for late structural valve deterioration in failure. Other alternatives including the Ross autograft procedure, homograft aortic root replacement, stentless bioprosthetic tissue valve replacement, valve repair, and transcatheter aortic valve replacement were discussed.  This discussion was placed in the context of the patient's particular circumstances, and as a result the patient specifically requests that their valve be replaced using a bioprosthetic tissue valve.  The patient and his wife are also counseled regarding the need for concomitant coronary artery bypass grafting do to the presence of significant proximal left anterior descending coronary artery and right coronary artery stenosis.  We discussed the presence of significant septal hypertrophy and the possibility that septal myomectomy might be  beneficial at the time of surgery.  They understand and accept all potential associated risks of surgery including but not limited to risk of death, stroke, myocardial infarction, congestive heart failure, respiratory failure, renal failure, pneumonia, bleeding requiring blood transfusion and or reexploration, arrhythmia, heart block or bradycardia requiring permanent pacemaker, pleural effusions or other delayed complications related to continued congestive heart failure, or other late complications related to valve replacement.  The patient desires to hold off for several weeks before proceeding with elective surgery. He has been counseled that there remains a very small but significant risk of acute myocardial infarction do to the presence of significant proximal coronary artery disease. In addition, the patient has been urged to seek consultation from the dental service for routine examination and dental cleaning prior to surgery since he has not seen a dentist in several years. We tentatively plan to proceed with surgery on Wednesday, July 10. The patient will return to the office for followup on Monday, July 8 prior to surgery.     Salvatore Decent. Cornelius Moras, MD 06/19/2011 2:20 PM

## 2011-06-22 ENCOUNTER — Other Ambulatory Visit: Payer: Self-pay | Admitting: Thoracic Surgery (Cardiothoracic Vascular Surgery)

## 2011-06-22 DIAGNOSIS — I2510 Atherosclerotic heart disease of native coronary artery without angina pectoris: Secondary | ICD-10-CM

## 2011-06-23 ENCOUNTER — Ambulatory Visit (HOSPITAL_COMMUNITY): Payer: Self-pay | Admitting: Dentistry

## 2011-06-23 ENCOUNTER — Other Ambulatory Visit: Payer: Self-pay

## 2011-06-23 ENCOUNTER — Encounter (HOSPITAL_COMMUNITY): Payer: Self-pay | Admitting: Dentistry

## 2011-06-23 VITALS — BP 142/80 | HR 70 | Temp 97.3°F

## 2011-06-23 DIAGNOSIS — I359 Nonrheumatic aortic valve disorder, unspecified: Secondary | ICD-10-CM

## 2011-06-23 DIAGNOSIS — K036 Deposits [accretions] on teeth: Secondary | ICD-10-CM

## 2011-06-23 DIAGNOSIS — I2510 Atherosclerotic heart disease of native coronary artery without angina pectoris: Secondary | ICD-10-CM

## 2011-06-23 DIAGNOSIS — M264 Malocclusion, unspecified: Secondary | ICD-10-CM

## 2011-06-23 DIAGNOSIS — K08109 Complete loss of teeth, unspecified cause, unspecified class: Secondary | ICD-10-CM

## 2011-06-23 DIAGNOSIS — K029 Dental caries, unspecified: Secondary | ICD-10-CM

## 2011-06-23 DIAGNOSIS — Z954 Presence of other heart-valve replacement: Secondary | ICD-10-CM

## 2011-06-23 DIAGNOSIS — K0530 Chronic periodontitis, unspecified: Secondary | ICD-10-CM

## 2011-06-23 MED ORDER — CHLORHEXIDINE GLUCONATE 0.12 % MT SOLN: OROMUCOSAL | Status: DC

## 2011-06-23 MED ORDER — AMOXICILLIN 500 MG PO CAPS: ORAL_CAPSULE | ORAL | Status: AC

## 2011-06-23 NOTE — Progress Notes (Signed)
DENTAL CONSULTATION  Date of Consultation:  06/23/2011 Patient Name:   Stephen Mendoza Date of Birth:   08-May-1940 Medical Record Number: [id number]  VITALS: BP 142/80  Pulse 70  Temp(Src) 97.3 F (36.3 C) (Oral)   CHIEF COMPLAINT: Patient referred for a pre-heart valve surgery dental protocol evaluation.  HPI: Stephen Mendoza is a 71 year old male referred by Dr. Tressie Stalker for a dental consultation. Patient with recent diagnosis of severe aortic stenosis and coronary artery disease.  Patient with anticipated aortic valve replacement along with a 2 vessel coronary artery bypass graft procedure. Patient is now seen as part of a pre-heart valve surgery dental protocol to rule out dental infection that  may affect the patient's systemic health and anticipated heart valve surgery.  The patient currently denies acute toothache, swellings, or abscesses. Patient was last seen approximately 5 years ago to have multiple extractions followed by subsequent fabrication of upper and lower partial dentures. Patient no longer wears the partial dentures and indicates that they "never really fit". Patient did not bring the partial dentures with him today. Patient indicates that his last dental cleaning was approximately 5 years ago as well. Patient last saw a Dr. Katrinka Blazing in [place], [place].  Patient Active Problem List  Diagnoses  . Aortic stenosis  . CAD (coronary artery disease)  . AS (aortic stenosis)  . Benign prostatic hypertrophy (BPH) with nocturia    PMH: Past Medical History  Diagnosis Date  . Hypertension     Essential  . Aortic stenosis     severe  . Palpitations   . Chest pain   . Aortic regurgitation   . Mitral regurgitation     Mild moderate  . Left atrial enlargement   . Polio osteopathy of lower leg   . Benign prostatic hypertrophy (BPH) with nocturia     PSH: Past Surgical History  Procedure Date  . Cataract extraction     ALLERGIES: No Known  Allergies  MEDICATIONS: Current Outpatient Prescriptions  Medication Sig Dispense Refill  . amLODipine (NORVASC) 5 MG tablet Take 1 tablet (5 mg total) by mouth daily.  30 tablet  5  . amoxicillin (AMOXIL) 500 MG capsule Take four capsules one hour before dental appointment.  4 capsule  2  . aspirin 81 MG tablet Take 81 mg by mouth 2 (two) times daily.       . chlorhexidine (PERIDEX) 0.12 % solution Rinse with 15 mls twice daily for 30 seconds. Use after breakfast and at bedtime. Spit out excess. Do not swallow.  480 mL  5  . Coenzyme Q10 (CO Q 10) 100 MG CAPS Take 100 mg by mouth 2 (two) times daily.       Marland Kitchen GRAPE SEED EXTRACT PO Take by mouth daily.      Marland Kitchen HAWTHORN PO Take 300 mg by mouth 2 (two) times daily.      Marland Kitchen losartan-hydrochlorothiazide (HYZAAR) 100-25 MG per tablet Take 1 tablet by mouth daily.        . Misc Natural Products (RED WINE EXTRACT PO) Take by mouth daily.      . Multiple Vitamin (MULTIVITAMIN) capsule Take 1 capsule by mouth daily.        . Omega-3 Fatty Acids (FISH OIL PO) Take 1,600 mg by mouth 2 (two) times daily.          LABS: Lab Results  Component Value Date   WBC 5.1 06/11/2011   HGB 12.9* 06/11/2011   HCT 38.7* 06/11/2011  MCV 93.4 06/11/2011   PLT 157.0 06/11/2011      Component Value Date/Time   NA 144 06/11/2011 1119   K 3.8 06/11/2011 1119   CL 103 06/11/2011 1119   CO2 33* 06/11/2011 1119   GLUCOSE 91 06/11/2011 1119   BUN 11 06/11/2011 1119   CREATININE 0.7 06/11/2011 1119   CALCIUM 9.1 06/11/2011 1119   Lab Results  Component Value Date   INR 1.0 06/11/2011   No results found for this basename: PTT    SOCIAL HISTORY: History   Social History  . Marital Status: Married    Spouse Name: N/A    Number of Children: N/A  . Years of Education: N/A   Occupational History  . Not on file.   Social History Main Topics  . Smoking status: Former Smoker    Quit date: 10/24/1970  . Smokeless tobacco: Not on file  . Alcohol Use: No  . Drug Use:  Not on file  . Sexually Active: Not on file   Other Topics Concern  . Not on file   Social History Narrative   Patient is married.Patient with a history of smoking a quarter pack a day for 5 years. Patient quit smoking on 10/24/1970. Patient has never used smokeless tobacco. Patient denies use of alcohol.    FAMILY HISTORY: Family History  Problem Relation Age of Onset  . Heart attack Father      REVIEW OF SYSTEMS: Reviewed with patient and positive as above.  DENTAL HISTORY: CHIEF COMPLAINT: Patient referred for a pre-heart valve surgery dental protocol evaluation.  HPI: Stephen Mendoza is a 71 year old male referred by Dr. Tressie Stalker for a dental consultation. Patient with recent diagnosis of severe aortic stenosis and coronary artery disease.  Patient with anticipated aortic valve replacement along with a 2 vessel coronary artery bypass graft procedure. Patient is now seen as part of a pre-heart valve surgery dental protocol to rule out dental infection that  may affect the patient's systemic health and anticipated heart valve surgery.  The patient currently denies acute toothache, swellings, or abscesses. Patient was last seen approximately 5 years ago to have multiple extractions followed by subsequent fabrication of upper and lower partial dentures. Patient no longer wears the partial dentures and indicates that they "never really fit". Patient did not bring a partial dentures with him today. Patient indicates that his last dental cleaning was approximately 5 years ago as well. Patient last saw a Dr. Katrinka Blazing in [place], [place].    DENTAL:  GENERAL: The patient is a well-developed, well-nourished male in no acute distress.  HEAD AND NECK: There is no palpable submandibular lymphadenopathy. The patient denies acute TMJ symptoms.  INTRAORAL EXAM:The patient has normal saliva. Patient has bilateral, multilobular mandibular lingual tori. There is no evidence of  abscess formation. DENTITION: The patient is missing tooth numbers 1, 2, 3, 4, 5, 13, 14, 16, 17, 18, 19, 29, 30, 31, and 32. PERIODONTAL:The patient has chronic periodontitis with plaque and calculus accumulations, generalized gingival recession and incipient mandibular anterior tooth mobility.  DENTAL CARIES/SUBOPTIMAL RESTORATIONS: The pateint has multiple dental caries and suboptimal dental restorations as per dental charting form.  ENDODONTIC: The patient denies acute pulpitis symptoms. There is no evidence of periapical pathology or radiolucency. CROWN AND BRIDGE: There are several crown restorations that appear to be acceptable. PROSTHODONTIC:The patient has a history of upper and lower partial dentures. Patient no longer wears the partial dentures however. OCCLUSION:The patient has a poor occlusal  scheme secondary to multiple missing teeth, and lack of replacement of the missing teeth with dental prostheses. Occlusion is stable however.  RADIOGRAPHIC INTERPRETATION: A Panoramic radiograph was taken along with 10 periapical radiographs. There are multiple missing teeth. There are multiple dental caries and suboptimal dental restorations noted. There is incipient to moderate bone loss. There is radiographic calculus noted. There is supra-eruption and drifting of the unopposed teeth into the edentulous areas. The are radiopaque ares consistent with mandibular tori.   ASSESSMENTS: 1. Chronic periodontitis with bone loss 2. Dental caries  3. Gingival recession. 4. Incipient tooth mobility of mandibular anterior teeth. 5. Bilateral mandibular lingual tori 6. History of ill fitting partial dentures 7. Supra-eruption and drifting of the unopposed teeth into the edentulous areas 8. Poor occlusal scheme but stable occlusion 9. Accretions   PLAN/RECOMMENDATIONS: 1. I discussed the risks, benefits, and complications of various treatment options with the patient in relationship to his medical  and dental conditions and anticipated heart valve surgery. We discussed various treatment options to include no treatment, pre-prosthetic surgery, periodontal therapy, dental restorations, root canal therapy, crown and bridge therapy, implant therapy, and replacement of missing teeth as indicated. The patient currently wishes to proceed with gross debridement of teeth with restorations with antibiotic premedication as per discussion with Dr. Camillo Flaming. The patient will then followup with a primary dentist of his choice for routine periodontal maintenance and evaluation of other treatment options at that time. The patient was provided a prescription for chlorhexidine rinse to sue twice a day for the next three to four months and amoxicillin for premedication use.   2. Discussion of findings with medical team and coordination of future medical and dental care.   Charlynne Pander, DDS

## 2011-06-29 ENCOUNTER — Encounter (HOSPITAL_COMMUNITY): Payer: Self-pay | Admitting: Dentistry

## 2011-06-29 ENCOUNTER — Ambulatory Visit (HOSPITAL_COMMUNITY): Payer: Self-pay | Admitting: Dentistry

## 2011-06-29 VITALS — BP 133/72 | HR 68 | Temp 97.8°F

## 2011-06-29 DIAGNOSIS — K036 Deposits [accretions] on teeth: Secondary | ICD-10-CM

## 2011-06-29 DIAGNOSIS — K0530 Chronic periodontitis, unspecified: Secondary | ICD-10-CM

## 2011-06-29 DIAGNOSIS — K029 Dental caries, unspecified: Secondary | ICD-10-CM

## 2011-06-29 NOTE — Progress Notes (Signed)
Monday, June 29, 2011   BP: 133/72       P: 68        T: 97.8  Stephen Mendoza presents for dental procedures. Patient took amoxicillin 2.0 grams one hour before the procedure. Antibiotic verification form completed. Patient was administered oxygen via nasal canula for the hour long procedure. O2 saturations kept at 99-100 percent. Procedures: 36 Xylocaine with epi vial infiltration #8-10. Z6109 #8 MIFL/Resin and DFL/Resin AE, Primer, DBA, Tetric Ceram EVO Shade A 2.0. Contour and polish. Removed overhangs from previous restorations on the mesial resin #10 and the distal resin #8. [place]. Periapical radiograph taken to verify acceptable margins. U0454. Gross debridement of remaining dentition with KaVo sonic scaler and selective hand curettes. [place]. Floss. Patient tolerated procedure well. Patient dismissed in stable condition.   Dr. Cindra Eves

## 2011-07-07 ENCOUNTER — Encounter: Payer: Self-pay | Admitting: Cardiovascular Disease

## 2011-07-07 ENCOUNTER — Encounter (HOSPITAL_COMMUNITY): Payer: Self-pay | Admitting: Dentistry

## 2011-07-07 ENCOUNTER — Ambulatory Visit (HOSPITAL_COMMUNITY): Payer: Self-pay | Admitting: Dentistry

## 2011-07-07 ENCOUNTER — Telehealth: Payer: Self-pay | Admitting: Cardiovascular Disease

## 2011-07-07 VITALS — BP 135/76 | HR 60 | Temp 97.9°F

## 2011-07-07 DIAGNOSIS — K029 Dental caries, unspecified: Secondary | ICD-10-CM

## 2011-07-07 DIAGNOSIS — K030 Excessive attrition of teeth: Secondary | ICD-10-CM

## 2011-07-07 MED ORDER — DOXAZOSIN MESYLATE 4 MG PO TABS: 4.0000 mg | ORAL_TABLET | Freq: Every day | ORAL | Status: DC

## 2011-07-07 NOTE — Telephone Encounter (Signed)
No answer at home number/ rings-no answer.  C/o  side effect from amlodipine being eye pain and a discomfort. Remembers that side effect when tried norvasc prior.

## 2011-07-07 NOTE — Telephone Encounter (Signed)
Pt to stop amlodipine start cardura 4mg , pt agreed to plan.

## 2011-07-07 NOTE — Telephone Encounter (Signed)
Pt wants to discuss a problem he is having with a medication and he wants to make sure he is to keep the appt that is scheduled right now

## 2011-07-07 NOTE — Progress Notes (Signed)
07/07/2011  Patient:            Stephen Mendoza Date of Birth:  [birth year] MRN:                [id number]  BP 135/76  Pulse 60  Temp 97.9 F (36.6 C)  Mardene Sayer presents for dental procedures. Patient took amoxicillin 2.0 grams one hour before the procedure. Antibiotic verification form completed. Procedures: 30 second chlorhexidine rinse. 36 Xylocaine with epi via infiltration #6 and mental nerve block lower right. N8295 #6  ML/Resin  AE, Primer, DBA, Tetric Ceram EVO Shade A 2.0. Contour and polish. A2130 #27  IFL/Resin  AE, Primer, DBA, Tetric Ceram EVO Shade A 1.0. Contour and polish. Q6578 #28 DO/Resin  AE, Primer, DBA, Tetric Ceram EVO Shade A 1.0. Contour and polish. Occlusion checked and adjusted as needed. Patient accepts results. Patient tolerated procedure well. Patient is now cleared for heart valve surgery with Dr. Cornelius Moras. Will assist in finding primary dentist as needed. Patient understands that he will need antibiotic premedication prior to invasive dental procedures once heart valve surgery is performed. Patient was dismissed in stable condition.  Dr. Cindra Eves.

## 2011-07-08 ENCOUNTER — Encounter (HOSPITAL_COMMUNITY): Payer: Self-pay | Admitting: Respiratory Therapy

## 2011-07-09 ENCOUNTER — Ambulatory Visit: Payer: Medicare Other | Admitting: Cardiovascular Disease

## 2011-07-09 ENCOUNTER — Other Ambulatory Visit (HOSPITAL_COMMUNITY): Payer: Medicare Other

## 2011-07-09 ENCOUNTER — Inpatient Hospital Stay (HOSPITAL_COMMUNITY): Admission: RE | Admit: 2011-07-09 | Discharge: 2011-07-09 | Payer: Medicare Other | Source: Ambulatory Visit

## 2011-07-09 ENCOUNTER — Encounter (HOSPITAL_COMMUNITY): Payer: Self-pay

## 2011-07-09 HISTORY — DX: Other specified postprocedural states: Z98.890

## 2011-07-09 HISTORY — DX: Shortness of breath: R06.02

## 2011-07-09 HISTORY — DX: Nausea with vomiting, unspecified: R11.2

## 2011-07-09 NOTE — Pre-Procedure Instructions (Signed)
20 Stephen Mendoza  07/09/2011   Your procedure is scheduled on:  **07/22/11*  Report to [facility] at 630 AM.  Call this number if you have problems the morning of surgery: [phone]   Remember:   Do not eat food:After Midnight.  May have clear liquids:until Midnight .  Clear liquids include soda, tea, black coffee, apple or grape juice, broth.  Take these medicines the morning of surgery with A SIP OF WATER: cardura   Do not wear jewelry, make-up or nail polish.  Do not wear lotions, powders, or perfumes. You may wear deodorant.  Do not shave 48 hours prior to surgery. Men may shave face and neck.  Do not bring valuables to the hospital.  Contacts, dentures or bridgework may not be worn into surgery.  Leave suitcase in the car. After surgery it may be brought to your room.  For patients admitted to the hospital, checkout time is 11:00 AM the day of discharge.   Patients discharged the day of surgery will not be allowed to drive home.  Name and phone number of your driver: family  Special Instructions: CHG Shower Use Special Wash: 1/2 bottle night before surgery and 1/2 bottle morning of surgery.   Please read over the following fact sheets that you were given: Pain Booklet, Coughing and Deep Breathing, Blood Transfusion Information, MRSA Information and Surgical Site Infection Prevention

## 2011-07-20 ENCOUNTER — Ambulatory Visit (INDEPENDENT_AMBULATORY_CARE_PROVIDER_SITE_OTHER): Payer: Medicare Other | Admitting: Thoracic Surgery (Cardiothoracic Vascular Surgery)

## 2011-07-20 ENCOUNTER — Encounter (HOSPITAL_COMMUNITY)
Admission: RE | Admit: 2011-07-20 | Discharge: 2011-07-20 | Disposition: A | Discharge status: Home / Self Care | Payer: Medicare Other | Source: Ambulatory Visit | Attending: Thoracic Surgery (Cardiothoracic Vascular Surgery) | Admitting: Thoracic Surgery (Cardiothoracic Vascular Surgery)

## 2011-07-20 ENCOUNTER — Encounter: Payer: Self-pay | Admitting: Thoracic Surgery (Cardiothoracic Vascular Surgery)

## 2011-07-20 ENCOUNTER — Ambulatory Visit: Payer: Medicare Other | Admitting: Thoracic Surgery (Cardiothoracic Vascular Surgery)

## 2011-07-20 ENCOUNTER — Ambulatory Visit (HOSPITAL_COMMUNITY)
Admission: RE | Admit: 2011-07-20 | Discharge: 2011-07-20 | Disposition: A | Discharge status: Home / Self Care | Payer: Medicare Other | Source: Ambulatory Visit | Attending: Thoracic Surgery (Cardiothoracic Vascular Surgery) | Admitting: Thoracic Surgery (Cardiothoracic Vascular Surgery)

## 2011-07-20 VITALS — BP 171/79 | HR 50 | Temp 97.1°F | Resp 18 | Ht 68.0 in | Wt 174.4 lb

## 2011-07-20 VITALS — BP 170/80 | HR 56 | Resp 18 | Ht 68.0 in | Wt 176.0 lb

## 2011-07-20 DIAGNOSIS — I2510 Atherosclerotic heart disease of native coronary artery without angina pectoris: Secondary | ICD-10-CM

## 2011-07-20 DIAGNOSIS — R351 Nocturia: Secondary | ICD-10-CM

## 2011-07-20 DIAGNOSIS — N401 Enlarged prostate with lower urinary tract symptoms: Secondary | ICD-10-CM

## 2011-07-20 DIAGNOSIS — I359 Nonrheumatic aortic valve disorder, unspecified: Secondary | ICD-10-CM

## 2011-07-20 DIAGNOSIS — Z01810 Encounter for preprocedural cardiovascular examination: Secondary | ICD-10-CM

## 2011-07-20 DIAGNOSIS — I350 Nonrheumatic aortic (valve) stenosis: Secondary | ICD-10-CM

## 2011-07-20 DIAGNOSIS — Z01812 Encounter for preprocedural laboratory examination: Secondary | ICD-10-CM | POA: Insufficient documentation

## 2011-07-20 DIAGNOSIS — N138 Other obstructive and reflux uropathy: Secondary | ICD-10-CM

## 2011-07-20 LAB — COMPREHENSIVE METABOLIC PANEL
AST: 26 U/L (ref 0–37)
BUN: 10 mg/dL (ref 6–23)
CO2: 26 mEq/L (ref 19–32)
Calcium: 9.7 mg/dL (ref 8.4–10.5)
Chloride: 103 mEq/L (ref 96–112)
Creatinine, Ser: 0.64 mg/dL (ref 0.50–1.35)
GFR calc Af Amer: >90 mL/min (ref >90)
GFR calc non Af Amer: >90 mL/min (ref >90)
Glucose, Bld: 109 mg/dL — ABNORMAL HIGH (ref 70–99)
Total Bilirubin: 0.3 mg/dL (ref 0.3–1.2)

## 2011-07-20 LAB — CBC
HCT: 39.8 % (ref 39.0–52.0)
Hemoglobin: 13.6 g/dL (ref 13.0–17.0)
MCH: 31.2 pg (ref 26.0–34.0)
MCV: 91.3 fL (ref 78.0–100.0)
RBC: 4.36 MIL/uL (ref 4.22–5.81)
WBC: 6 10*3/uL (ref 4.0–10.5)

## 2011-07-20 LAB — URINALYSIS, ROUTINE W REFLEX MICROSCOPIC
Bilirubin Urine: NEGATIVE
Hgb urine dipstick: NEGATIVE
Nitrite: NEGATIVE
Specific Gravity, Urine: 1.012 (ref 1.005–1.030)
Urobilinogen, UA: 0.2 mg/dL (ref 0.0–1.0)
pH: 6.5 (ref 5.0–8.0)

## 2011-07-20 LAB — BLOOD GAS, ARTERIAL
Acid-Base Excess: 3.9 mmol/L — ABNORMAL HIGH (ref 0.0–2.0)
Bicarbonate: 28 mEq/L — ABNORMAL HIGH (ref 20.0–24.0)
Drawn by: 206361
O2 Saturation: 95.4 %
TCO2: 29.3 mmol/L (ref 0–100)
pCO2 arterial: 42.8 mmHg (ref 35.0–45.0)
pO2, Arterial: 77.3 mmHg — ABNORMAL LOW (ref 80.0–100.0)

## 2011-07-20 LAB — PROTIME-INR: Prothrombin Time: 13.1 seconds (ref 11.6–15.2)

## 2011-07-20 LAB — PULMONARY FUNCTION TEST

## 2011-07-20 LAB — APTT: aPTT: 33 seconds (ref 24–37)

## 2011-07-20 MED ORDER — CHLORHEXIDINE GLUCONATE 4 % EX LIQD: 30.0000 mL | CUTANEOUS | Status: DC

## 2011-07-20 NOTE — Pre-Procedure Instructions (Signed)
20 ZACKARIE CHASON  07/20/2011   Your procedure is scheduled on:  Wed, July 10 @ 8:30 AM  Report to [facility] at 6:30 AM.  Call this number if you have problems the morning of surgery: [phone]   Remember:   Do not eat food:After Midnight.     Do not wear jewelry  Do not wear lotions, powders, or cologne  Men may shave face and neck.  Do not bring valuables to the hospital.  Contacts, dentures or bridgework may not be worn into surgery.  Leave suitcase in the car. After surgery it may be brought to your room.  For patients admitted to the hospital, checkout time is 11:00 AM the day of discharge.   Patients discharged the day of surgery will not be allowed to drive home.  Special Instructions: Incentive Spirometry - Practice and bring it with you on the day of surgery. and CHG Shower Use Special Wash: 1/2 bottle night before surgery and 1/2 bottle morning of surgery.   Please read over the following fact sheets that you were given: Pain Booklet, Coughing and Deep Breathing, Blood Transfusion Information, Open Heart Packet, MRSA Information and Surgical Site Infection Prevention

## 2011-07-20 NOTE — Progress Notes (Signed)
VASCULAR LAB PRELIMINARY  PRELIMINARY  PRELIMINARY  PRELIMINARY  Pre-op Cardiac Surgery  Carotid Findings:  No evidence of significant ICA stenosis and vertebral artery flow is antegrade bilaterally.    Upper Extremity Right Left  Brachial Pressures 172 T 169 T  Radial Waveforms T T  Ulnar Waveforms T T  Palmar Arch (Allen's Test) * *   Findings:  *Doppler waveforms remain normal with ulnar and radial compressions bilaterally.      Lower  Extremity Right Left  Dorsalis Pedis    Anterior Tibial    Posterior Tibial    Ankle/Brachial Indices      Findings:  Palpable pedal pulses x 4.     Stephen Mendoza, 07/20/2011, 12:46 PM

## 2011-07-20 NOTE — Progress Notes (Signed)
                   [address]            [name] [address]          [phone]     CARDIOTHORACIC SURGERY OFFICE NOTE  Referring Provider is Nahser, Deloris Ping, MD PCP is Judge Stall, MD   HPI:  Patient returns for follow up of severe aortic stenosis and coronary artery disease.  He was originally seen in consultation on 06/19/2011.  Since then he has been seen in consultation by Dr Robin Searing and cleared for surgery.  He reports no new problems except for a sensation of having a difficult time keeping his eyes open since he resumed taking amlodipine.  He stopped amlodipine 1 1/2 weeks ago and seems some better.  He otherwise has no complaints.   Current Outpatient Prescriptions  Medication Sig Dispense Refill  . aspirin 81 MG tablet Take 81 mg by mouth 2 (two) times daily.       . Coenzyme Q10 (CO Q 10) 100 MG CAPS Take 100 mg by mouth 2 (two) times daily.       Marland Kitchen HAWTHORNE PO Take 300 mg by mouth 2 (two) times daily.      Marland Kitchen losartan-hydrochlorothiazide (HYZAAR) 100-25 MG per tablet Take 1 tablet by mouth daily.        . Multiple Vitamin (MULTIVITAMIN) capsule Take 1 capsule by mouth daily.        . Omega-3 Fatty Acids (FISH OIL PO) Take 1,600 mg by mouth 2 (two) times daily.        Marland Kitchen pyridOXINE (B-6) 50 MG tablet Take 50 mg by mouth daily.          Physical Exam:   BP 170/80  Pulse 56  Resp 18  Ht 5\' 8"  (1.727 m)  Wt 176 lb (79.833 kg)  BMI 26.76 kg/m2  SpO2 98%  General:  Well appearing  Chest:   clear  CV:   RRR  With systolic murmur  Incisions:  n/a  Abdomen:  soft  Extremities:  warm  Diagnostic Tests:  n/a   Impression:  Severe symptomatic aortic stenosis with severe LVH and possible LVOT obstruction plus 2-vessel CAD  Plan:  We plan aortic valve replacement and coronary artery bypass grafting with possible septal myomectomy on Wednesday 07/22/2011.  I again reviewed the indications, risks and potential benefits of surgery.  Mr Sachse  specifically requests that his valve be replaced using a bioprosthetic tissue valve.  All questions answered.   Salvatore Decent. Cornelius Moras, MD 07/20/2011 10:34 AM

## 2011-07-20 NOTE — Progress Notes (Signed)
Confirmed that PFT's and Dopplers were done today July 8th

## 2011-07-21 LAB — HEMOGLOBIN A1C: Mean Plasma Glucose: 117 mg/dL — ABNORMAL HIGH (ref <117)

## 2011-07-21 MED ORDER — MAGNESIUM SULFATE 50 % IJ SOLN
40.0000 meq | INTRAMUSCULAR | Status: DC
  Filled 2011-07-21: qty 10

## 2011-07-21 MED ORDER — DEXTROSE 5 % IV SOLN
750.0000 mg | INTRAVENOUS | Status: DC
  Filled 2011-07-21: qty 750

## 2011-07-21 MED ORDER — PHENYLEPHRINE HCL 10 MG/ML IJ SOLN
30.0000 ug/min | INTRAVENOUS | Status: AC
  Administered 2011-07-22: 10 ug via INTRAVENOUS
  Filled 2011-07-21: qty 2

## 2011-07-21 MED ORDER — TRANEXAMIC ACID (OHS) PUMP PRIME SOLUTION
2.0000 mg/kg | INTRAVENOUS | Status: DC
  Filled 2011-07-21: qty 1.58

## 2011-07-21 MED ORDER — NITROGLYCERIN IN D5W 200-5 MCG/ML-% IV SOLN
2.0000 ug/min | INTRAVENOUS | Status: AC
  Administered 2011-07-22: 5 ug/min via INTRAVENOUS
  Filled 2011-07-21: qty 250

## 2011-07-21 MED ORDER — PLASMA-LYTE 148 IV SOLN
INTRAVENOUS | Status: AC
  Administered 2011-07-22: 09:00:00
  Filled 2011-07-21: qty 2.5

## 2011-07-21 MED ORDER — SODIUM CHLORIDE 0.9 % IV SOLN
INTRAVENOUS | Status: AC
  Administered 2011-07-22: 1.9 [IU]/h via INTRAVENOUS
  Filled 2011-07-21: qty 1

## 2011-07-21 MED ORDER — SODIUM CHLORIDE 0.9 % IV SOLN
1250.0000 mg | INTRAVENOUS | Status: AC
  Administered 2011-07-22: 1250 mg via INTRAVENOUS
  Filled 2011-07-21: qty 1250

## 2011-07-21 MED ORDER — TRANEXAMIC ACID 100 MG/ML IV SOLN
1.5000 mg/kg/h | INTRAVENOUS | Status: AC
  Administered 2011-07-22: 1.5 mg/kg/h via INTRAVENOUS
  Filled 2011-07-21: qty 25

## 2011-07-21 MED ORDER — TRANEXAMIC ACID (OHS) BOLUS VIA INFUSION
15.0000 mg/kg | INTRAVENOUS | Status: AC
  Administered 2011-07-22: 1183.5 mg via INTRAVENOUS
  Filled 2011-07-21: qty 1184

## 2011-07-21 MED ORDER — DEXMEDETOMIDINE HCL IN NACL 400 MCG/100ML IV SOLN
0.1000 ug/kg/h | INTRAVENOUS | Status: AC
  Administered 2011-07-22: .2 ug/kg/h via INTRAVENOUS
  Filled 2011-07-21: qty 100

## 2011-07-21 MED ORDER — DEXTROSE 5 % IV SOLN
1.5000 g | INTRAVENOUS | Status: AC
  Administered 2011-07-22: 1.5 g via INTRAVENOUS
  Administered 2011-07-22: .75 g via INTRAVENOUS
  Filled 2011-07-21: qty 1.5

## 2011-07-21 MED ORDER — METOPROLOL TARTRATE 12.5 MG HALF TABLET
12.5000 mg | ORAL_TABLET | Freq: Once | ORAL | Status: DC
Start: 2011-07-21 — End: 2011-07-22

## 2011-07-21 MED ORDER — DEXTROSE 5 % IV SOLN
0.5000 ug/min | INTRAVENOUS | Status: DC
  Filled 2011-07-21: qty 4

## 2011-07-21 MED ORDER — POTASSIUM CHLORIDE 2 MEQ/ML IV SOLN
80.0000 meq | INTRAVENOUS | Status: DC
  Filled 2011-07-21: qty 40

## 2011-07-21 MED ORDER — DOPAMINE-DEXTROSE 3.2-5 MG/ML-% IV SOLN
2.0000 ug/kg/min | INTRAVENOUS | Status: DC
  Filled 2011-07-21: qty 250

## 2011-07-21 NOTE — Consult Note (Signed)
Anesthesia Chart Review:  Patient is a 71 year old male scheduled for AVR, CABG, septal myomectomy on 07/22/11 by Dr. Cornelius Moras.  History includes CAD, severe AS, possible LVOT obstruction, post-operative N/V, HTN, polio osteopathy of the leg, BPH, former smoker.  PCP is Dr. Judge Stall.  Cardiologist is Dr. Elease Hashimoto.  EKG on 07/20/11 showed SB @ 46, anterior infarct (age undetermined).  Heart cath on 06/15/11 showed: 1. Moderate - severe CAD involving the LAD and 1st diag and RCA. 2. Known severe Aortic stenosis.  Echo on 05/28/11 showed: - Left ventricle: The cavity size was normal. Wall thickness was increased in a pattern of moderate LVH. Systolic function was normal. The estimated ejection fraction was in the range of 60% to 65%. Doppler parameters are consistent with abnormal left ventricular relaxation (grade 1 diastolic dysfunction). - Aortic valve: AV is difficult to see well. It is thickened, calcified with restricted motion. Peak and mean gradients through the valve are112 and 57 mm mm Hg respectively consistent withcritical AS. Trivial regurgitation. Mean gradient: 57mm Hg (S). Peak gradient: Hg (S). - Mitral valve: Mild regurgitation. - Pulmonary arteries: PA peak pressure: 34mm Hg (S).  CXR shows no acute cardiopulmonary abnormality.  Labs noted.    Plan to proceed from an Anesthesia standpoint.  Shonna Chock, PA-C

## 2011-07-22 ENCOUNTER — Encounter (HOSPITAL_COMMUNITY)
Admission: RE | Disposition: A | Discharge status: Home / Self Care | Payer: Self-pay | Source: Ambulatory Visit | Attending: Thoracic Surgery (Cardiothoracic Vascular Surgery)

## 2011-07-22 ENCOUNTER — Encounter (HOSPITAL_COMMUNITY): Payer: Self-pay | Admitting: Vascular Surgery

## 2011-07-22 ENCOUNTER — Inpatient Hospital Stay (HOSPITAL_COMMUNITY): Payer: Medicare Other

## 2011-07-22 ENCOUNTER — Inpatient Hospital Stay (HOSPITAL_COMMUNITY)
Admission: RE | Admit: 2011-07-22 | Discharge: 2011-07-26 | DRG: 220 | Disposition: A | Discharge status: Home / Self Care | Payer: Medicare Other | Source: Ambulatory Visit | Attending: Thoracic Surgery (Cardiothoracic Vascular Surgery) | Admitting: Thoracic Surgery (Cardiothoracic Vascular Surgery)

## 2011-07-22 ENCOUNTER — Encounter (HOSPITAL_COMMUNITY): Payer: Self-pay | Admitting: *Deleted

## 2011-07-22 ENCOUNTER — Ambulatory Visit (HOSPITAL_COMMUNITY): Payer: Medicare Other | Admitting: Vascular Surgery

## 2011-07-22 DIAGNOSIS — Z951 Presence of aortocoronary bypass graft: Secondary | ICD-10-CM

## 2011-07-22 DIAGNOSIS — Z8249 Family history of ischemic heart disease and other diseases of the circulatory system: Secondary | ICD-10-CM

## 2011-07-22 DIAGNOSIS — I10 Essential (primary) hypertension: Secondary | ICD-10-CM | POA: Diagnosis present

## 2011-07-22 DIAGNOSIS — I2510 Atherosclerotic heart disease of native coronary artery without angina pectoris: Secondary | ICD-10-CM

## 2011-07-22 DIAGNOSIS — N138 Other obstructive and reflux uropathy: Secondary | ICD-10-CM | POA: Diagnosis present

## 2011-07-22 DIAGNOSIS — E8779 Other fluid overload: Secondary | ICD-10-CM | POA: Diagnosis not present

## 2011-07-22 DIAGNOSIS — R351 Nocturia: Secondary | ICD-10-CM | POA: Diagnosis present

## 2011-07-22 DIAGNOSIS — Z953 Presence of xenogenic heart valve: Secondary | ICD-10-CM

## 2011-07-22 DIAGNOSIS — Z952 Presence of prosthetic heart valve: Secondary | ICD-10-CM

## 2011-07-22 DIAGNOSIS — I359 Nonrheumatic aortic valve disorder, unspecified: Principal | ICD-10-CM | POA: Diagnosis present

## 2011-07-22 DIAGNOSIS — Z87891 Personal history of nicotine dependence: Secondary | ICD-10-CM

## 2011-07-22 DIAGNOSIS — Z79899 Other long term (current) drug therapy: Secondary | ICD-10-CM

## 2011-07-22 DIAGNOSIS — D62 Acute posthemorrhagic anemia: Secondary | ICD-10-CM | POA: Diagnosis not present

## 2011-07-22 DIAGNOSIS — Z7982 Long term (current) use of aspirin: Secondary | ICD-10-CM

## 2011-07-22 DIAGNOSIS — N401 Enlarged prostate with lower urinary tract symptoms: Secondary | ICD-10-CM | POA: Diagnosis present

## 2011-07-22 DIAGNOSIS — D696 Thrombocytopenia, unspecified: Secondary | ICD-10-CM | POA: Diagnosis not present

## 2011-07-22 HISTORY — DX: Presence of aortocoronary bypass graft: Z95.1

## 2011-07-22 HISTORY — PX: CORONARY ARTERY BYPASS GRAFT: SHX141

## 2011-07-22 HISTORY — DX: Gastro-esophageal reflux disease without esophagitis: K21.9

## 2011-07-22 HISTORY — DX: Presence of xenogenic heart valve: Z95.3

## 2011-07-22 HISTORY — DX: Atherosclerotic heart disease of native coronary artery without angina pectoris: I25.10

## 2011-07-22 HISTORY — PX: AORTIC VALVE REPLACEMENT: SHX41

## 2011-07-22 LAB — POCT I-STAT 4, (NA,K, GLUC, HGB,HCT)
Glucose, Bld: 119 mg/dL — ABNORMAL HIGH (ref 70–99)
Glucose, Bld: 101 mg/dL — ABNORMAL HIGH (ref 70–99)
Glucose, Bld: 138 mg/dL — ABNORMAL HIGH (ref 70–99)
Glucose, Bld: 102 mg/dL — ABNORMAL HIGH (ref 70–99)
HCT: 34 % — ABNORMAL LOW (ref 39.0–52.0)
HCT: 25 % — ABNORMAL LOW (ref 39.0–52.0)
HCT: 25 % — ABNORMAL LOW (ref 39.0–52.0)
HCT: 28 % — ABNORMAL LOW (ref 39.0–52.0)
Hemoglobin: 11.6 g/dL — ABNORMAL LOW (ref 13.0–17.0)
Hemoglobin: 8.5 g/dL — ABNORMAL LOW (ref 13.0–17.0)
Hemoglobin: 9.5 g/dL — ABNORMAL LOW (ref 13.0–17.0)
Potassium: 3.2 mEq/L — ABNORMAL LOW (ref 3.5–5.1)
Potassium: 4.2 mEq/L (ref 3.5–5.1)
Potassium: 3 mEq/L — ABNORMAL LOW (ref 3.5–5.1)
Sodium: 142 mEq/L (ref 135–145)
Sodium: 142 mEq/L (ref 135–145)
Sodium: 142 mEq/L (ref 135–145)

## 2011-07-22 LAB — CBC
HCT: 27.4 % — ABNORMAL LOW (ref 39.0–52.0)
Hemoglobin: 9.6 g/dL — ABNORMAL LOW (ref 13.0–17.0)
MCH: 31.2 pg (ref 26.0–34.0)
MCHC: 35 g/dL (ref 30.0–36.0)
MCV: 88.7 fL (ref 78.0–100.0)
Platelets: 118 10*3/uL — ABNORMAL LOW (ref 150–400)
RDW: 13.5 % (ref 11.5–15.5)
WBC: 13.8 10*3/uL — ABNORMAL HIGH (ref 4.0–10.5)

## 2011-07-22 LAB — POCT I-STAT 3, ART BLOOD GAS (G3+)
Acid-Base Excess: 4 mmol/L — ABNORMAL HIGH (ref 0.0–2.0)
O2 Saturation: 100 %
Patient temperature: 35.8
TCO2: 30 mmol/L (ref 0–100)
pCO2 arterial: 38.4 mmHg (ref 35.0–45.0)
pH, Arterial: 7.521 — ABNORMAL HIGH (ref 7.350–7.450)
pO2, Arterial: 356 mmHg — ABNORMAL HIGH (ref 80.0–100.0)

## 2011-07-22 LAB — POCT I-STAT, CHEM 8
BUN: 9 mg/dL (ref 6–23)
Creatinine, Ser: 0.6 mg/dL (ref 0.50–1.35)
Glucose, Bld: 167 mg/dL — ABNORMAL HIGH (ref 70–99)
Hemoglobin: 8.5 g/dL — ABNORMAL LOW (ref 13.0–17.0)
Potassium: 4 mEq/L (ref 3.5–5.1)

## 2011-07-22 LAB — GLUCOSE, CAPILLARY: Glucose-Capillary: 111 mg/dL — ABNORMAL HIGH (ref 70–99)

## 2011-07-22 LAB — CREATININE, SERUM
Creatinine, Ser: 0.54 mg/dL (ref 0.50–1.35)
GFR calc non Af Amer: >90 mL/min (ref >90)

## 2011-07-22 LAB — HEMOGLOBIN AND HEMATOCRIT, BLOOD
HCT: 25 % — ABNORMAL LOW (ref 39.0–52.0)
Hemoglobin: 8.8 g/dL — ABNORMAL LOW (ref 13.0–17.0)

## 2011-07-22 LAB — PLATELET COUNT: Platelets: 97 10*3/uL — ABNORMAL LOW (ref 150–400)

## 2011-07-22 SURGERY — AORTIC VALVE REPLACEMENT (AVR)/CORONARY ARTERY BYPASS GRAFTING (CABG)
Anesthesia: General | Site: Chest | Wound class: Clean

## 2011-07-22 MED ORDER — ALBUMIN HUMAN 5 % IV SOLN
INTRAVENOUS | Status: AC
  Filled 2011-07-22: qty 250

## 2011-07-22 MED ORDER — LIDOCAINE HCL (CARDIAC) 20 MG/ML IV SOLN
INTRAVENOUS | Status: DC | PRN
  Administered 2011-07-22: 100 mg via INTRAVENOUS

## 2011-07-22 MED ORDER — SODIUM CHLORIDE 0.9 % IJ SOLN
OROMUCOSAL | Status: DC | PRN
  Administered 2011-07-22: 09:00:00 via TOPICAL

## 2011-07-22 MED ORDER — FAMOTIDINE IN NACL 20-0.9 MG/50ML-% IV SOLN
20.0000 mg | Freq: Two times a day (BID) | INTRAVENOUS | Status: AC
  Administered 2011-07-22 (×2): 20 mg via INTRAVENOUS
  Filled 2011-07-22: qty 50

## 2011-07-22 MED ORDER — ACETAMINOPHEN 500 MG PO TABS
1000.0000 mg | ORAL_TABLET | Freq: Four times a day (QID) | ORAL | Status: DC
  Administered 2011-07-23 – 2011-07-25 (×7): 1000 mg via ORAL
  Administered 2011-07-25: 500 mg via ORAL
  Administered 2011-07-25: 1000 mg via ORAL
  Administered 2011-07-26: 500 mg via ORAL
  Filled 2011-07-22 (×18): qty 2

## 2011-07-22 MED ORDER — FENTANYL CITRATE 0.05 MG/ML IJ SOLN
INTRAMUSCULAR | Status: DC | PRN
Start: 2011-07-22 — End: 2011-07-22
  Administered 2011-07-22: 650 ug via INTRAVENOUS
  Administered 2011-07-22 (×3): 100 ug via INTRAVENOUS
  Administered 2011-07-22: 50 ug via INTRAVENOUS
  Administered 2011-07-22: 100 ug via INTRAVENOUS

## 2011-07-22 MED ORDER — OXYCODONE HCL 5 MG PO TABS: 5.0000 mg | ORAL_TABLET | ORAL | Status: DC | PRN

## 2011-07-22 MED ORDER — ACETAMINOPHEN 160 MG/5ML PO SOLN
975.0000 mg | Freq: Four times a day (QID) | ORAL | Status: DC
  Administered 2011-07-23: 975 mg
  Filled 2011-07-22: qty 20.3

## 2011-07-22 MED ORDER — SODIUM CHLORIDE 0.9 % IV SOLN
INTRAVENOUS | Status: DC
  Administered 2011-07-22: 20 mL/h via INTRAVENOUS

## 2011-07-22 MED ORDER — MIDAZOLAM HCL 5 MG/5ML IJ SOLN
INTRAMUSCULAR | Status: DC | PRN
  Administered 2011-07-22: 2 mg via INTRAVENOUS
  Administered 2011-07-22: 3 mg via INTRAVENOUS
  Administered 2011-07-22: 2 mg via INTRAVENOUS
  Administered 2011-07-22: 3 mg via INTRAVENOUS

## 2011-07-22 MED ORDER — HEMOSTATIC AGENTS (NO CHARGE) OPTIME
TOPICAL | Status: DC | PRN
  Administered 2011-07-22: 1 via TOPICAL

## 2011-07-22 MED ORDER — SODIUM BICARBONATE 8.4 % IV SOLN
INTRAVENOUS | Status: AC
  Filled 2011-07-22: qty 50

## 2011-07-22 MED ORDER — PANTOPRAZOLE SODIUM 40 MG PO TBEC
40.0000 mg | DELAYED_RELEASE_TABLET | Freq: Every day | ORAL | Status: DC
  Administered 2011-07-24: 40 mg via ORAL
  Filled 2011-07-22: qty 1

## 2011-07-22 MED ORDER — HEPARIN SODIUM (PORCINE) 1000 UNIT/ML IJ SOLN
INTRAMUSCULAR | Status: AC
  Filled 2011-07-22: qty 1

## 2011-07-22 MED ORDER — 0.9 % SODIUM CHLORIDE (POUR BTL) OPTIME
TOPICAL | Status: DC | PRN
  Administered 2011-07-22: 6000 mL

## 2011-07-22 MED ORDER — DEXTROSE 5 % IV SOLN
1.5000 g | Freq: Two times a day (BID) | INTRAVENOUS | Status: DC
  Administered 2011-07-22 – 2011-07-23 (×3): 1.5 g via INTRAVENOUS
  Filled 2011-07-22 (×4): qty 1.5

## 2011-07-22 MED ORDER — ASPIRIN 81 MG PO CHEW: 324.0000 mg | CHEWABLE_TABLET | Freq: Every day | ORAL | Status: DC

## 2011-07-22 MED ORDER — INSULIN ASPART 100 UNIT/ML ~~LOC~~ SOLN
0.0000 [IU] | SUBCUTANEOUS | Status: DC
  Administered 2011-07-23: 4 [IU] via SUBCUTANEOUS
  Administered 2011-07-23: 2 [IU] via SUBCUTANEOUS
  Administered 2011-07-23 (×2): 4 [IU] via SUBCUTANEOUS

## 2011-07-22 MED ORDER — ASPIRIN EC 325 MG PO TBEC
325.0000 mg | DELAYED_RELEASE_TABLET | Freq: Every day | ORAL | Status: DC
  Administered 2011-07-23 – 2011-07-26 (×4): 325 mg via ORAL
  Filled 2011-07-22 (×4): qty 1

## 2011-07-22 MED ORDER — SODIUM CHLORIDE 0.9 % IV SOLN
INTRAVENOUS | Status: DC | PRN
  Administered 2011-07-22: 10:00:00 via INTRAVENOUS

## 2011-07-22 MED ORDER — METOPROLOL TARTRATE 25 MG/10 ML ORAL SUSPENSION
12.5000 mg | Freq: Two times a day (BID) | ORAL | Status: DC
  Filled 2011-07-22 (×5): qty 5

## 2011-07-22 MED ORDER — BISACODYL 5 MG PO TBEC
10.0000 mg | DELAYED_RELEASE_TABLET | Freq: Every day | ORAL | Status: DC
  Administered 2011-07-23 – 2011-07-24 (×2): 10 mg via ORAL
  Filled 2011-07-22: qty 2
  Filled 2011-07-22: qty 1

## 2011-07-22 MED ORDER — METOPROLOL TARTRATE 12.5 MG HALF TABLET
12.5000 mg | ORAL_TABLET | Freq: Two times a day (BID) | ORAL | Status: DC
  Administered 2011-07-23 – 2011-07-24 (×4): 12.5 mg via ORAL
  Filled 2011-07-22 (×7): qty 1

## 2011-07-22 MED ORDER — SODIUM CHLORIDE 0.9 % IV SOLN
INTRAVENOUS | Status: DC
  Filled 2011-07-22: qty 1

## 2011-07-22 MED ORDER — PHENYLEPHRINE HCL 10 MG/ML IJ SOLN
0.0000 ug/min | INTRAVENOUS | Status: DC
  Filled 2011-07-22: qty 2

## 2011-07-22 MED ORDER — HEPARIN SODIUM (PORCINE) 1000 UNIT/ML IJ SOLN
INTRAMUSCULAR | Status: DC | PRN
  Administered 2011-07-22: 28000 [IU] via INTRAVENOUS

## 2011-07-22 MED ORDER — INSULIN ASPART 100 UNIT/ML ~~LOC~~ SOLN
0.0000 [IU] | SUBCUTANEOUS | Status: AC
  Administered 2011-07-22: 2 [IU] via SUBCUTANEOUS

## 2011-07-22 MED ORDER — INSULIN REGULAR BOLUS VIA INFUSION
0.0000 [IU] | Freq: Three times a day (TID) | INTRAVENOUS | Status: DC
  Filled 2011-07-22: qty 10

## 2011-07-22 MED ORDER — ALBUMIN HUMAN 5 % IV SOLN
INTRAVENOUS | Status: DC | PRN
  Administered 2011-07-22 (×2): via INTRAVENOUS

## 2011-07-22 MED ORDER — ACETAMINOPHEN 650 MG RE SUPP
650.0000 mg | RECTAL | Status: AC
  Administered 2011-07-22: 650 mg via RECTAL

## 2011-07-22 MED ORDER — CALCIUM CHLORIDE 10 % IV SOLN
1.0000 g | Freq: Once | INTRAVENOUS | Status: AC | PRN
  Filled 2011-07-22: qty 10

## 2011-07-22 MED ORDER — METOPROLOL TARTRATE 1 MG/ML IV SOLN: 2.5000 mg | INTRAVENOUS | Status: DC | PRN

## 2011-07-22 MED ORDER — LACTATED RINGERS IV SOLN
INTRAVENOUS | Status: DC | PRN
  Administered 2011-07-22 (×3): via INTRAVENOUS

## 2011-07-22 MED ORDER — BISACODYL 10 MG RE SUPP: 10.0000 mg | Freq: Every day | RECTAL | Status: DC

## 2011-07-22 MED ORDER — ROCURONIUM BROMIDE 100 MG/10ML IV SOLN
INTRAVENOUS | Status: DC | PRN
  Administered 2011-07-22: 60 mg via INTRAVENOUS
  Administered 2011-07-22: 30 mg via INTRAVENOUS

## 2011-07-22 MED ORDER — SODIUM CHLORIDE 0.9 % IJ SOLN
3.0000 mL | INTRAMUSCULAR | Status: DC | PRN
  Administered 2011-07-22: 15:00:00 via INTRAVENOUS

## 2011-07-22 MED ORDER — MIDAZOLAM HCL 2 MG/2ML IJ SOLN: 2.0000 mg | INTRAMUSCULAR | Status: DC | PRN

## 2011-07-22 MED ORDER — SODIUM CHLORIDE 0.9 % IV SOLN: 250.0000 mL | INTRAVENOUS | Status: DC

## 2011-07-22 MED ORDER — NITROGLYCERIN IN D5W 200-5 MCG/ML-% IV SOLN
0.0000 ug/min | INTRAVENOUS | Status: DC
  Filled 2011-07-22: qty 250

## 2011-07-22 MED ORDER — POTASSIUM CHLORIDE 10 MEQ/50ML IV SOLN
10.0000 meq | Freq: Once | INTRAVENOUS | Status: AC
  Administered 2011-07-22: 10 meq via INTRAVENOUS

## 2011-07-22 MED ORDER — ACETAMINOPHEN 160 MG/5ML PO SOLN: 650.0000 mg | ORAL | Status: AC

## 2011-07-22 MED ORDER — SODIUM CHLORIDE 0.45 % IV SOLN
INTRAVENOUS | Status: DC
  Administered 2011-07-22: 20 mL/h via INTRAVENOUS
  Administered 2011-07-24: 06:00:00 via INTRAVENOUS

## 2011-07-22 MED ORDER — SODIUM CHLORIDE 0.9 % IJ SOLN
3.0000 mL | Freq: Two times a day (BID) | INTRAMUSCULAR | Status: DC
  Administered 2011-07-23 – 2011-07-24 (×2): 3 mL via INTRAVENOUS

## 2011-07-22 MED ORDER — DOCUSATE SODIUM 100 MG PO CAPS
200.0000 mg | ORAL_CAPSULE | Freq: Every day | ORAL | Status: DC
  Administered 2011-07-23 – 2011-07-26 (×3): 200 mg via ORAL
  Filled 2011-07-22: qty 2
  Filled 2011-07-22: qty 1
  Filled 2011-07-22 (×3): qty 2

## 2011-07-22 MED ORDER — ONDANSETRON HCL 4 MG/2ML IJ SOLN
4.0000 mg | Freq: Four times a day (QID) | INTRAMUSCULAR | Status: DC | PRN
  Administered 2011-07-23: 4 mg via INTRAVENOUS
  Filled 2011-07-22: qty 2

## 2011-07-22 MED ORDER — LACTATED RINGERS IV SOLN: INTRAVENOUS | Status: DC

## 2011-07-22 MED ORDER — MORPHINE SULFATE 2 MG/ML IJ SOLN
1.0000 mg | INTRAMUSCULAR | Status: AC | PRN
  Administered 2011-07-22: 2 mg via INTRAVENOUS

## 2011-07-22 MED ORDER — MORPHINE SULFATE 2 MG/ML IJ SOLN
2.0000 mg | INTRAMUSCULAR | Status: DC | PRN
  Administered 2011-07-23 (×2): 2 mg via INTRAVENOUS
  Filled 2011-07-22: qty 1
  Filled 2011-07-22: qty 2

## 2011-07-22 MED ORDER — PROTAMINE SULFATE 10 MG/ML IV SOLN
INTRAVENOUS | Status: DC | PRN
  Administered 2011-07-22: 100 mg via INTRAVENOUS
  Administered 2011-07-22: 125 mg via INTRAVENOUS
  Administered 2011-07-22: 25 mg via INTRAVENOUS

## 2011-07-22 MED ORDER — DEXTROSE 5 % IV SOLN
INTRAVENOUS | Status: DC | PRN
  Administered 2011-07-22: 10:00:00 via INTRAVENOUS

## 2011-07-22 MED ORDER — MAGNESIUM SULFATE 40 MG/ML IJ SOLN
4.0000 g | Freq: Once | INTRAMUSCULAR | Status: AC
  Administered 2011-07-22: 4 g via INTRAVENOUS
  Filled 2011-07-22: qty 100

## 2011-07-22 MED ORDER — VANCOMYCIN HCL IN DEXTROSE 1-5 GM/200ML-% IV SOLN
1000.0000 mg | Freq: Once | INTRAVENOUS | Status: AC
  Administered 2011-07-23: 1000 mg via INTRAVENOUS
  Filled 2011-07-22: qty 200

## 2011-07-22 MED ORDER — POTASSIUM CHLORIDE 10 MEQ/50ML IV SOLN
10.0000 meq | INTRAVENOUS | Status: AC
  Administered 2011-07-22 (×4): 10 meq via INTRAVENOUS

## 2011-07-22 MED ORDER — PROPOFOL 10 MG/ML IV EMUL
INTRAVENOUS | Status: DC | PRN
  Administered 2011-07-22: 60 mg via INTRAVENOUS

## 2011-07-22 MED ORDER — ALBUMIN HUMAN 5 % IV SOLN
250.0000 mL | INTRAVENOUS | Status: AC | PRN
  Administered 2011-07-22 (×2): 250 mL via INTRAVENOUS

## 2011-07-22 MED ORDER — VECURONIUM BROMIDE 10 MG IV SOLR
INTRAVENOUS | Status: DC | PRN
  Administered 2011-07-22: 5 mg via INTRAVENOUS
  Administered 2011-07-22: 2 mg via INTRAVENOUS
  Administered 2011-07-22: 3 mg via INTRAVENOUS
  Administered 2011-07-22 (×2): 5 mg via INTRAVENOUS

## 2011-07-22 MED ORDER — DEXMEDETOMIDINE HCL IN NACL 200 MCG/50ML IV SOLN
0.1000 ug/kg/h | INTRAVENOUS | Status: DC
  Filled 2011-07-22: qty 50

## 2011-07-22 MED FILL — Dexmedetomidine HCl in NaCl 0.9% IV Soln 400 MCG/100ML: INTRAVENOUS | Qty: 100 | Status: AC

## 2011-07-22 MED FILL — Insulin Regular (Human) Inj 100 Unit/ML: INTRAMUSCULAR | Qty: 1 | Status: AC

## 2011-07-22 SURGICAL SUPPLY — 97 items
ADAPTER CARDIO PERF ANTE/RETRO (ADAPTER) ×2 IMPLANT
APL SKNCLS STERI-STRIP NONHPOA (GAUZE/BANDAGES/DRESSINGS) ×1
APPLIER CLIP 9.375 SM OPEN (CLIP) ×2
APR CLP SM 9.3 20 MLT OPN (CLIP) ×1
ATTRACTOMAT 16X20 MAGNETIC DRP (DRAPES) ×2 IMPLANT
BAG DECANTER FOR FLEXI CONT (MISCELLANEOUS) ×2 IMPLANT
BANDAGE ELASTIC 4 VELCRO ST LF (GAUZE/BANDAGES/DRESSINGS) ×4 IMPLANT
BANDAGE ELASTIC 6 VELCRO ST LF (GAUZE/BANDAGES/DRESSINGS) ×4 IMPLANT
BANDAGE GAUZE ELAST BULKY 4 IN (GAUZE/BANDAGES/DRESSINGS) ×4 IMPLANT
BENZOIN TINCTURE PRP APPL 2/3 (GAUZE/BANDAGES/DRESSINGS) ×2 IMPLANT
BLADE SURG 11 STRL SS (BLADE) ×4 IMPLANT
CANISTER SUCTION 2500CC (MISCELLANEOUS) ×2 IMPLANT
CANN PRFSN .5XCNCT 15X34-48 (MISCELLANEOUS)
CANNULA GUNDRY RCSP 15FR (MISCELLANEOUS) ×2 IMPLANT
CANNULA PRFSN .5XCNCT 15X34-48 (MISCELLANEOUS) IMPLANT
CANNULA SOFTFLOW AORTIC 7M21FR (CANNULA) ×2 IMPLANT
CANNULA VEN 2 STAGE (MISCELLANEOUS)
CATH HEART VENT LEFT (CATHETERS) ×1 IMPLANT
CATH ROBINSON RED A/P 18FR (CATHETERS) ×6 IMPLANT
CATH THORACIC 36FR (CATHETERS) ×2 IMPLANT
CATH THORACIC 36FR RT ANG (CATHETERS) ×2 IMPLANT
CLIP APPLIE 9.375 SM OPEN (CLIP) ×1 IMPLANT
CLIP RETRACTION 3.0MM CORONARY (MISCELLANEOUS) ×2 IMPLANT
CLIP TI MEDIUM 24 (CLIP) IMPLANT
CLIP TI WIDE RED SMALL 24 (CLIP) IMPLANT
CLOTH BEACON ORANGE TIMEOUT ST (SAFETY) ×2 IMPLANT
CONT SPEC 4OZ CLIKSEAL STRL BL (MISCELLANEOUS) ×4 IMPLANT
COVER SURGICAL LIGHT HANDLE (MISCELLANEOUS) ×4 IMPLANT
CRADLE DONUT ADULT HEAD (MISCELLANEOUS) ×2 IMPLANT
DRAIN CHANNEL 32F RND 10.7 FF (WOUND CARE) ×2 IMPLANT
DRAPE SLUSH/WARMER DISC (DRAPES) ×2 IMPLANT
DRSG COVADERM 4X14 (GAUZE/BANDAGES/DRESSINGS) ×2 IMPLANT
ELECT REM PT RETURN 9FT ADLT (ELECTROSURGICAL) ×2
ELECTRODE REM PT RTRN 9FT ADLT (ELECTROSURGICAL) ×1 IMPLANT
GLOVE BIO SURGEON STRL SZ 6 (GLOVE) ×6 IMPLANT
GLOVE BIO SURGEON STRL SZ 6.5 (GLOVE) ×6 IMPLANT
GLOVE ORTHO TXT STRL SZ7.5 (GLOVE) ×6 IMPLANT
GOWN STRL NON-REIN LRG LVL3 (GOWN DISPOSABLE) ×16 IMPLANT
HEMOSTAT POWDER SURGIFOAM 1G (HEMOSTASIS) ×6 IMPLANT
INSERT FOGARTY XLG (MISCELLANEOUS) IMPLANT
KIT BASIN OR (CUSTOM PROCEDURE TRAY) ×2 IMPLANT
KIT ROOM TURNOVER OR (KITS) ×2 IMPLANT
KIT SUCTION CATH 14FR (SUCTIONS) ×4 IMPLANT
LINE VENT (MISCELLANEOUS) ×2 IMPLANT
MARKER GRAFT CORONARY BYPASS (MISCELLANEOUS) ×6 IMPLANT
NEEDLE AORTIC AIR ASPIRATING (NEEDLE) IMPLANT
NS IRRIG 1000ML POUR BTL (IV SOLUTION) ×14 IMPLANT
PACK OPEN HEART (CUSTOM PROCEDURE TRAY) ×2 IMPLANT
PAD ARMBOARD 7.5X6 YLW CONV (MISCELLANEOUS) ×4 IMPLANT
PENCIL BUTTON HOLSTER BLD 10FT (ELECTRODE) ×2 IMPLANT
PUNCH AORTIC ROTATE 4.0MM (MISCELLANEOUS) ×2 IMPLANT
PUNCH AORTIC ROTATE 4.5MM 8IN (MISCELLANEOUS) IMPLANT
PUNCH AORTIC ROTATE 5MM 8IN (MISCELLANEOUS) IMPLANT
SET CARDIOPLEGIA MPS 5001102 (MISCELLANEOUS) ×2 IMPLANT
SET IRRIG TUBING LAPAROSCOPIC (IRRIGATION / IRRIGATOR) ×4 IMPLANT
SPONGE GAUZE 4X4 12PLY (GAUZE/BANDAGES/DRESSINGS) ×6 IMPLANT
SPONGE LAP 18X18 X RAY DECT (DISPOSABLE) ×4 IMPLANT
SPONGE LAP 4X18 X RAY DECT (DISPOSABLE) ×2 IMPLANT
STRIP CLOSURE SKIN 1/2X4 (GAUZE/BANDAGES/DRESSINGS) ×2 IMPLANT
SUT BONE WAX W31G (SUTURE) ×2 IMPLANT
SUT ETHIBON 2 0 V 52N 30 (SUTURE) ×6 IMPLANT
SUT ETHIBON EXCEL 2-0 V-5 (SUTURE) IMPLANT
SUT ETHIBOND 2 0 SH (SUTURE) ×4 IMPLANT
SUT ETHIBOND 2 0 V4 (SUTURE) IMPLANT
SUT ETHIBOND 2 0V4 GREEN (SUTURE) IMPLANT
SUT ETHIBOND 4 0 RB 1 (SUTURE) IMPLANT
SUT ETHIBOND NAB MH 2-0 36IN (SUTURE) ×2 IMPLANT
SUT ETHIBOND V-5 VALVE (SUTURE) IMPLANT
SUT ETHIBOND X763 2 0 SH 1 (SUTURE) ×6 IMPLANT
SUT MNCRL AB 3-0 PS2 18 (SUTURE) ×4 IMPLANT
SUT MNCRL AB 4-0 PS2 18 (SUTURE) ×2 IMPLANT
SUT PDS AB 1 CTX 36 (SUTURE) ×4 IMPLANT
SUT PROLENE 3 0 SH DA (SUTURE) ×2 IMPLANT
SUT PROLENE 4 0 RB 1 (SUTURE) ×7
SUT PROLENE 4 0 SH DA (SUTURE) ×2 IMPLANT
SUT PROLENE 4-0 RB1 .5 CRCL 36 (SUTURE) ×7 IMPLANT
SUT PROLENE 6 0 C 1 30 (SUTURE) ×4 IMPLANT
SUT PROLENE 7 0 DA (SUTURE) IMPLANT
SUT PROLENE 7.0 RB 3 (SUTURE) ×8 IMPLANT
SUT PROLENE 8 0 BV175 6 (SUTURE) IMPLANT
SUT PROLENE BLUE 7 0 (SUTURE) ×2 IMPLANT
SUT SILK  1 MH (SUTURE) ×2
SUT SILK 1 MH (SUTURE) ×2 IMPLANT
SUT STEEL 6MS V (SUTURE) IMPLANT
SUT VIC AB 2-0 CT1 27 (SUTURE) ×2
SUT VIC AB 2-0 CT1 TAPERPNT 27 (SUTURE) ×1 IMPLANT
SUT VIC AB 2-0 CTX 27 (SUTURE) IMPLANT
SUTURE E-PAK OPEN HEART (SUTURE) ×2 IMPLANT
SYSTEM SAHARA CHEST DRAIN ATS (WOUND CARE) ×2 IMPLANT
TOWEL OR 17X24 6PK STRL BLUE (TOWEL DISPOSABLE) ×2 IMPLANT
TOWEL OR 17X26 10 PK STRL BLUE (TOWEL DISPOSABLE) ×4 IMPLANT
TRAY FOLEY IC TEMP SENS 14FR (CATHETERS) ×2 IMPLANT
TUBING INSUFFLATION 10FT LAP (TUBING) ×2 IMPLANT
UNDERPAD 30X30 INCONTINENT (UNDERPADS AND DIAPERS) ×2 IMPLANT
VALVE MAGNA EASE 21MM (Prosthesis & Implant Heart) ×2 IMPLANT
VENT LEFT HEART 12002 (CATHETERS) ×2
WATER STERILE IRR 1000ML POUR (IV SOLUTION) ×4 IMPLANT

## 2011-07-22 NOTE — Progress Notes (Signed)
Metoprolol held  Due to pulse 58 this am.

## 2011-07-22 NOTE — Anesthesia Preprocedure Evaluation (Addendum)
Anesthesia Evaluation  Patient identified by MRN, date of birth, ID band Patient awake    Reviewed: Allergy & Precautions, H&P , NPO status , Patient's Chart, lab work & pertinent test results  History of Anesthesia Complications (+) PONV  Airway Mallampati: I TM Distance: >3 FB Neck ROM: Full    Dental  (+) Teeth Intact and Dental Advisory Given   Pulmonary shortness of breath and with exertion, former smoker,  breath sounds clear to auscultation        Cardiovascular hypertension, Pt. on medications + CAD + Valvular Problems/Murmurs AS Rhythm:Regular Rate:Normal + Systolic murmurs    Neuro/Psych    GI/Hepatic negative GI ROS, Neg liver ROS,   Endo/Other  negative endocrine ROS  Renal/GU History of BPH     Musculoskeletal   Abdominal   Peds  Hematology negative hematology ROS (+)   Anesthesia Other Findings   Reproductive/Obstetrics                         Anesthesia Physical Anesthesia Plan  ASA: IV  Anesthesia Plan: General   Post-op Pain Management:    Induction: Intravenous  Airway Management Planned: Oral ETT  Additional Equipment: Arterial line, PA Cath, Ultrasound Guidance Line Placement and TEE  Intra-op Plan:   Post-operative Plan: Post-operative intubation/ventilation  Informed Consent: I have reviewed the patients History and Physical, chart, labs and discussed the procedure including the risks, benefits and alternatives for the proposed anesthesia with the patient or authorized representative who has indicated his/her understanding and acceptance.   Dental advisory given  Plan Discussed with: Anesthesiologist, Surgeon and CRNA  Anesthesia Plan Comments:        Anesthesia Quick Evaluation

## 2011-07-22 NOTE — Anesthesia Postprocedure Evaluation (Signed)
  Anesthesia Post-op Note  Patient: Stephen Mendoza  Procedure(s) Performed: Procedure(s) (LRB): AORTIC VALVE REPLACEMENT (AVR)/CORONARY ARTERY BYPASS GRAFTING (CABG) (N/A)  Patient Location: SICU  Anesthesia Type: General  Level of Consciousness: sedated and Patient remains intubated per anesthesia plan  Airway and Oxygen Therapy: Patient remains intubated per anesthesia plan and Patient placed on Ventilator (see vital sign flow sheet for setting)  Post-op Pain: none  Post-op Assessment: Post-op Vital signs reviewed  Post-op Vital Signs: Reviewed  Complications: No apparent anesthesia complications

## 2011-07-22 NOTE — Preoperative (Signed)
Beta Blockers   Reason not to administer Beta Blockers:Not Applicable 

## 2011-07-22 NOTE — H&P (Signed)
CARDIOTHORACIC SURGERY HISTORY AND PHYSICAL EXAM  Referring Provider is Nahser, Deloris Ping, MD PCP is Judge Stall, MD, MD    Chief Complaint   Patient presents with   .  Aortic Stenosis       referral from Dr Elease Hashimoto for eval on aortic stenosis and coronary artery disease, cardiac cath 06/15/11   .  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  The patient states that he was first told he had a heart murmur or than 40 years ago. He was first diagnosed with aortic stenosis approximately 4 years ago when his primary care physician referred him to a cardiologist in [place]. An echocardiogram was performed and the patient was told that he would someday require aortic valve replacement. At that time he remained asymptomatic.  2 years ago he first began to experience occasional episodes of chest discomfort associated with strenuous physical activity.  The patient describes these episodes as a feeling of cold sensation in his upper chest and throat, such as what might occur if one were to take a very deep breath of extremely cold air. He was seen at that time by a cardiologist in [place] and he was told that he should have aortic valve replacement surgery. He elected to hold off on any further evaluation at that time.  More recently the patient has developed progressive mild exertional shortness of breath and fatigue. Symptoms are always brought on with physical activity and relieved by rest. The patient's exercise tolerance is definitely decreased. He was referred to Dr. Elease Hashimoto and an echocardiogram was performed confirming the presence of severe aortic stenosis with normal left ventricular systolic function. The patient subsequently underwent left and right heart catheterization which also revealed the presence of severe two-vessel coronary artery disease. The patient has now been  referred to consider elective aortic valve replacement and coronary artery bypass grafting.   Past Medical History  Diagnosis Date  . Hypertension     Essential  . Aortic stenosis     severe  . Palpitations   . Chest pain   . Aortic regurgitation   . Mitral regurgitation     Mild moderate  . Left atrial enlargement   . Polio osteopathy of lower leg   . Benign prostatic hypertrophy (BPH) with nocturia   . PONV (postoperative nausea and vomiting)   . Shortness of breath     Past Surgical History  Procedure Date  . Cataract extraction   . Cardiac catheterization     Family History  Problem Relation Age of Onset  . Heart attack Father     Social History History  Substance Use Topics  . Smoking status: Former Smoker    Types: Cigarettes    Quit date: 10/24/1970  . Smokeless tobacco: Not on file  . Alcohol Use: No    Prior to Admission medications   Medication Sig Start Date End Date Taking? Authorizing Provider  aspirin 81 MG tablet Take 81 mg by mouth 2 (two) times daily.    Yes Historical Provider, MD  Coenzyme Q10 (CO Q 10) 100 MG CAPS Take 100 mg by mouth 2 (two) times daily.    Yes Historical Provider, MD  losartan-hydrochlorothiazide (HYZAAR) 100-25 MG per tablet Take 1 tablet by mouth daily.     Yes Historical Provider, MD  Multiple Vitamin (MULTIVITAMIN) capsule Take 1 capsule  by mouth daily.     Yes Historical Provider, MD  Omega-3 Fatty Acids (FISH OIL PO) Take 1,600 mg by mouth 2 (two) times daily.     Yes Historical Provider, MD  HAWTHORNE PO Take 300 mg by mouth 2 (two) times daily.    Historical Provider, MD  pyridOXINE (B-6) 50 MG tablet Take 50 mg by mouth daily.    Historical Provider, MD    No Known Allergies  Review of Systems:             General:                      normal appetite, decreased energy               Respiratory:                no cough, no wheezing, no hemoptysis, no pain with inspiration or cough, + shortness of breath with  exertion             Cardiac:                       no chest pain or tightness, + exertional SOB, no resting SOB, no PND, no orthopnea, no LE edema, no palpitations, no syncope             GI:                                + occasional mild difficulty swallowing, no hematochezia, no hematemesis, no melena, no constipation, no diarrhea               GU:                              no dysuria, no urgency, + frequency, + nocturia             Musculoskeletal:         no arthritis, no arthralgia, slight limp related to right leg             Vascular:                     no pain suggestive of claudication             Neuro:                         no symptoms suggestive of TIA's, no seizures, no headaches, no peripheral neuropathy               Endocrine:                   Negative               HEENT:                       no loose teeth or painful teeth but hasn't seen a dentist in years,  no recent vision changes             Psych:                         no anxiety, no depression                Physical  Exam:              BP 157/87  Pulse 75  Resp 20  Ht 5\' 8"  (1.727 m)  Wt 173 lb (78.472 kg)  BMI 26.30 kg/m2  SpO2 98%             General:                        well-appearing             HEENT:                       Unremarkable               Neck:                           no JVD, no bruits, no adenopathy               Chest:                         clear to auscultation, symmetrical breath sounds, no wheezes, no rhonchi               CV:                              RRR, grade III/VI systolic murmur               Abdomen:                    soft, non-tender, no masses               Extremities:                 warm, well-perfused, pulses diminished, no LE edema             Rectal/GU                   Deferred             Neuro:                         Grossly non-focal and symmetrical throughout             Skin:                            Clean and dry, no rashes, no  breakdown    Diagnostic Tests:  Transthoracic Echocardiography  Patient:    Stephen Mendoza, Stephen Mendoza MR #:       [id number] Study Date: 05/28/2011  ------------------------------------------------------------ LV EF: 60% -   65% ------------------------------------------------------------ Study Conclusions  - Left ventricle: The cavity size was normal. Wall thickness   was increased in a pattern of moderate LVH. Systolic   function was normal. The estimated ejection fraction was   in the range of 60% to 65%. Doppler parameters are   consistent with abnormal left ventricular relaxation   (grade 1 diastolic dysfunction). - Aortic valve: AV is difficult to see well. It is   thickened, calcified with restricted motion. Peak and mean   gradients through the valve are112 and 57 mm mm Hg   respectively consistent withcritical AS. Trivial   regurgitation. Mean gradient:  57mm Hg (S). Peak gradient:   Hg (S). - Mitral valve: Mild regurgitation. - Pulmonary arteries: PA peak pressure: 34mm Hg (S). ---------------------------------- ------------------------------------------------------------ Left ventricle:  The cavity size was normal. Wall thickness was increased in a pattern of moderate LVH. Systolic function was normal. The estimated ejection fraction was in the range of 60% to 65%. Doppler parameters are consistent with abnormal left ventricular relaxation (grade 1 diastolic dysfunction). ------------------------------------------------------------ Aortic valve:  AV is difficult to see well. It is thickened, calcified with restricted motion. Peak and mean gradients through the valve are112 and 57 mm mm Hg respectively consistent withcritical AS.  Doppler:   Trivial regurgitation.    VTI ratio of LVOT to aortic valve: 0.21. Valve area: 0.89cm^2(VTI). Indexed valve area: 0.44cm^2/m^2 (VTI). Peak velocity ratio of LVOT to aortic valve: 0.22. Valve area: 0.92cm^2 (Vmax). Indexed  valve area: 0.46cm^2/m^2 (Vmax).    Mean gradient: 57mm Hg (S). Peak gradient: Hg (S). ------------------------------------------------------------ Mitral valve:   Mildly thickened leaflets .  Doppler:   Mild regurgitation.    Peak gradient: 2mm Hg (D). ------------------------------------------------------------ Left atrium:  The atrium was mildly dilated. ------------------------------------------------------------ Right ventricle:  The cavity size was normal. Wall thickness was normal. Systolic function was normal. ------------------------------------------------------------ Pulmonic valve:    Structurally normal valve.   Cusp separation was normal.  Doppler:  Transvalvular velocity was within the normal range.  No regurgitation. ------------------------------------------------------------ Tricuspid valve:   Structurally normal valve.   Leaflet separation was normal.  Doppler:  Transvalvular velocity was within the normal range.  No significant regurgitation.  ------------------------------------------------------------ Right atrium:  The atrium was normal in size. ------------------------------------------------------------ Pericardium:  There was no pericardial effusion. ------------------------------------------------------------ Systemic veins: Inferior vena cava: The vessel was normal in size; the respirophasic diameter changes were in the normal range (= 50%); findings are consistent with normal central venous pressure. ------------------------------------------------------------  2D measurements        Normal  Doppler measurements   Normal Left ventricle                 Main pulmonary LVID ED,   36.4 mm     43-52   artery chord,                         Pressure,    34 mm Hg  =30 PLAX                           S LVID ES,   17.7 mm     23-38   Left ventricle chord,                         Ea, lat    5.48 cm/s   ------ PLAX                           ann, tiss FS,  chord,   51 %      >29     DP PLAX                           E/Ea, lat  13.8        ------ LVPW, ED   16.7 mm     ------  ann, tiss     7 IVS/LVPW   0.96        <1.3  DP ratio, ED                      Ea, med     5.7 cm/s   ------ Ventricular septum             ann, tiss IVS, ED    16.1 mm     ------  DP LVOT                           E/Ea, med  13.3        ------ Diam, S      23 mm     ------  ann, tiss     3 Area       4.15 cm^2   ------  DP Diam         23 mm     ------  LVOT Aorta                          Peak vel,   117 cm/s   ------ Root diam,   30 mm     ------  S ED                             VTI, S     27.6 cm     ------ AAo AP       33 mm     ------  Peak          5 mm Hg  ------ diam, S                        gradient, Left atrium                    S AP dim       43 mm     ------  Stroke vol 114. ml     ------ AP dim     2.14 cm/m^2 <2.2                  7 index                          Stroke     57.1 ml/m^2 ------                                index                                Aortic valve                                Peak vel,   529 cm/s   ------                                S                                Mean vel,   346 cm/s   ------  S                                VTI, S      129 cm     ------                                Mean         57 mm Hg  ------                                gradient,                                S                                Peak        112 mm Hg  ------                                gradient,                                S                                VTI ratio  0.21        ------                                LVOT/AV                                Area, VTI  0.89 cm^2   ------                                Area index 0.44 cm^2/m ------                                (VTI)           ^2                                Peak vel   0.22        ------                                 ratio,                                LVOT/AV                                Area, Vmax 0.92 cm^2   ------  Area index 0.46 cm^2/m ------                                (Vmax)          ^2                                Mitral valve                                Peak E vel   76 cm/s   ------                                Peak A vel 96.7 cm/s   ------                                Decelerati  197 ms     150-23                                on time                0                                Peak          2 mm Hg  ------                                gradient,                                D                                Peak E/A    0.8        ------                                ratio                                Tricuspid valve                                Regurg      271 cm/s   ------                                peak vel                                Peak RV-RA   29 mm Hg  ------  gradient,                                S                                Systemic veins                                Estimated     5 mm Hg  ------                                CVP                                Right ventricle                                Pressure,    34 mm Hg  <30                                S                                Sa vel,    14.3 cm/s   ------                                lat ann,                                tiss DP   ------------------------------------------------------------ Prepared and Electronically Authenticated by  Dietrich Pates, MD 2013-05-16T17:15:[id number]     Cardiac Cath Note  SLEVIN GUNBY [id number] 1940/02/24  Procedure: Right and Left Heart Cardiac Catheterization Note Indications: Aortic stenosis, dyspnea   Hemodynamics:    RA: 10/8/6 RV: 33/4/7 PCWP: 17/15/13 PA:  31/14/20  Cardiac Output              Thermodilution: 4.6 lpm, index of 2.4              Fick : 4.9 lpm , index of 2.5  Arterial Sat: 93 PA Sat: 65.  LV pressure: no obtained, did not cross the aortic valve Aortic pressure: 141/75/101  Angiography    Left Main: smooth and normal  Left anterior Descending: The LAD gives off an early 1st diagonal.  This 1st diagonal has a long diffuse 60-70% stenosis.  The mid LAD has a 50-60 segmental stenosis in the proximal segment.  The 2nd diagonal has a 20% stenosis at it's takeoff.  The distal LAD has a 20-30 % stenosis.  Left Circumflex: The LCx is a moderate - large vessel.  It gives off several moderate sized marginals that are fairly normal.  Right Coronary Artery: Large and dominant.  The prox / mid vessel has a long lesion that ranges from 50-60 % in severity.  The distal RCA has minor luminal irregularities.  The PDA and PLSA are normal.  LV Gram: no  LV gram was performed    Impression:  The patient has severe symptomatic aortic stenosis with severe two-vessel coronary artery disease and normal left ventricular systolic function. Patient has stable symptoms of exertional shortness of breath and fatigue.  I agree that he needs aortic valve replacement and coronary artery bypass grafting.  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  He may require septal myomectomy at the time of aortic valve replacement. The patient otherwise is in remarkably good health and appears to be an excellent surgical candidate. He has not seen a dentist in several years although he does not report any ongoing symptoms worrisome for any sort of dental infection.   Plan:  The patient was counseled at length regarding surgical alternatives with respect to valve replacement. In particular, discussion was held comparing in contrast and the risks of mechanical valve replacement and the need for lifelong  anticoagulation versus use of a bioprosthetic tissue valve and the associated potential for late structural valve deterioration in failure. Other alternatives including the Ross autograft procedure, homograft aortic root replacement, stentless bioprosthetic tissue valve replacement, valve repair, and transcatheter aortic valve replacement were discussed.  This discussion was placed in the context of the patient's particular circumstances, and as a result the patient specifically requests that their valve be replaced using a bioprosthetic tissue valve.  The patient and his wife are also counseled regarding the need for concomitant coronary artery bypass grafting do to the presence of significant proximal left anterior descending coronary artery and right coronary artery stenosis.  We discussed the presence of significant septal hypertrophy and the possibility that septal myomectomy might be beneficial at the time of surgery.  They understand and accept all potential associated risks of surgery including but not limited to risk of death, stroke, myocardial infarction, congestive heart failure, respiratory failure, renal failure, pneumonia, bleeding requiring blood transfusion and or reexploration, arrhythmia, heart block or bradycardia requiring permanent pacemaker, pleural effusions or other delayed complications related to continued congestive heart failure, or other late complications related to valve replacement.   He has been seen in consultation by Dr Robin Searing and cleared for surgery.  He reports no new problems except for a sensation of having a difficult time keeping his eyes open since he resumed taking amlodipine.  He stopped amlodipine 1 1/2 weeks ago and seems some better.  He otherwise has no complaints.  He now presents for elective aortic valve replacement, coronary artery bypass grafting and possible septal myomectomy.  All questions have been answered.    Salvatore Decent. Cornelius Moras, MD

## 2011-07-22 NOTE — Transfer of Care (Signed)
Immediate Anesthesia Transfer of Care Note  Patient: Stephen Mendoza  Procedure(s) Performed: Procedure(s) (LRB): AORTIC VALVE REPLACEMENT (AVR)/CORONARY ARTERY BYPASS GRAFTING (CABG) (N/A)  Patient Location: SICU  Anesthesia Type: General  Level of Consciousness: sedated and unresponsive  Airway & Oxygen Therapy: Patient remains intubated per anesthesia plan  Post-op Assessment: Report given to PACU RN and Post -op Vital signs reviewed and stable  Post vital signs: Reviewed and stable  Complications: No apparent anesthesia complications

## 2011-07-22 NOTE — Interval H&P Note (Signed)
History and Physical Interval Note:  07/22/2011 9:20 AM  Stephen Mendoza  has presented today for surgery, with the diagnosis of CAD,AS  The various methods of treatment have been discussed with the patient and family. After consideration of risks, benefits and other options for treatment, the patient has consented to  Procedure(s) (LRB): AORTIC VALVE REPLACEMENT (AVR)/CORONARY ARTERY BYPASS GRAFTING (CABG) (N/A) as a surgical intervention .  The patient's history has been reviewed, patient examined, no change in status, stable for surgery.  I have reviewed the patients' chart and labs.  Questions were answered to the patient's satisfaction.     OWEN,CLARENCE H

## 2011-07-22 NOTE — Progress Notes (Signed)
Failed second weaning attempt. Follows all commands (sticks out tongue, lifts head off pillow, wiggles toes) unable to stay awake during CPAP. Will put back on full-support until further awake. Thresa Ross RN

## 2011-07-22 NOTE — Progress Notes (Signed)
  Echocardiogram Echocardiogram Transesophageal has been performed.  Georgian Co 07/22/2011, 10:39 AM

## 2011-07-22 NOTE — Progress Notes (Signed)
PT failed Cpap trial at 10/5, no PT effort, and low volumes

## 2011-07-22 NOTE — OR Nursing (Signed)
14:05 1st call to SICU, call to vol. Desk .  14:30 2nd call to SICU

## 2011-07-22 NOTE — Brief Op Note (Signed)
07/22/2011  1:18 PM  PATIENT:  Stephen Mendoza  71 y.o. male  PRE-OPERATIVE DIAGNOSIS:  CAD,AS  POST-OPERATIVE DIAGNOSIS:  CAD,AS  PROCEDURE:  Procedure(s) (LRB):  AORTIC VALVE REPLACEMENT (AVR) -21mm Edwards Magna Ease Pericardial Tissue Valve  SEPTAL MYOMECTOMY  CORONARY ARTERY BYPASS GRAFTING x2  LIMA to Distal LAD  SVG to PDA  ENDOSCOPIC SAPHENOUS VEIN HARVEST LEFT THIGH  SURGEON:    Purcell Nails, MD  ASSISTANTS:  [name], PA-C  ANESTHESIA:   Kerby Nora, MD  CROSSCLAMP TIME:   104'  CARDIOPULMONARY BYPASS TIME: 130'   FINDINGS:  Normal LV systolic function  Severe calcific aortic stenosis  Good quality LIMA conduit  Fair quality saphenous vein conduit  Good quality target vessels  COMPLICATIONS: none  PATIENT DISPOSITION:   TO SICU IN STABLE CONDITION  OWEN,CLARENCE H 07/22/2011 2:51 PM

## 2011-07-22 NOTE — Progress Notes (Signed)
Dr Cornelius Moras updated on patient. Made aware that he has failed to wean x2 and is still very sleepy and not breathing over ventilator. Thresa Ross RN

## 2011-07-22 NOTE — Op Note (Signed)
CARDIOTHORACIC SURGERY OPERATIVE NOTE  Date of Procedure:  07/22/2011  Preoperative Diagnosis:   Severe Aortic Stenosis  Asymmetric Septal Hypertrophy  Severe 2-vessel Coronary Artery Disease  Postoperative Diagnosis: Same  Procedure:    Aortic Valve Replacement  [employer] Magna Ease Pericardial Tissue Valve (size 21mm, model # 3300TFX, serial # [id number])   Septal Myomectomy   Coronary Artery Bypass Grafting x 2   Left Internal Mammary Artery to Distal Left Anterior Descending Coronary Artery  Saphenous Vein Graft to Posterior Descending Coronary Artery  Endoscopic Vein Harvest from Right Thigh  Surgeon: Salvatore Decent. 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  The patient states that he was first told he had a heart murmur or than 40 years ago. He was first diagnosed with aortic stenosis approximately 4 years ago when his primary care physician referred him to a cardiologist in [place]. An echocardiogram was performed and the patient was told that he would someday require aortic valve replacement. At that time he remained asymptomatic.  2 years ago he first began to experience occasional episodes of chest discomfort associated with strenuous physical activity.  The patient describes these episodes as a feeling of cold sensation in his upper chest and throat, such as what might occur if one were to take a very deep breath of extremely cold air. He was seen at that time by a cardiologist in [place] and he was told that he should have aortic valve  replacement surgery. He elected to hold off on any further evaluation at that time.  More recently the patient has developed progressive mild exertional shortness of breath and fatigue. Symptoms are always brought on with physical activity and relieved by rest. The patient's exercise tolerance is definitely decreased. He was referred to Dr. Elease Hashimoto and an echocardiogram was performed confirming the presence of severe aortic stenosis with normal left ventricular systolic function. The patient subsequently underwent left and right heart catheterization which also revealed the presence of severe two-vessel coronary artery disease. The patient has been referred to consider elective aortic valve replacement and coronary artery bypass grafting.  The patient has been seen in consultation and counseled at length regarding the indications, risks and potential benefits of surgery.  All questions have been answered, and the patient provides full informed consent for the operation as described.     DETAILS OF THE OPERATIVE PROCEDURE  The patient is brought to the operating room on the above mentioned date and central monitoring was established by the anesthesia team including placement of Swan-Ganz catheter and radial arterial line. The patient is placed in the supine position on the operating table.  Intravenous antibiotics are administered. General endotracheal anesthesia is induced uneventfully. A Foley catheter is placed.  Baseline transesophageal echocardiogram was performed.  Findings were notable for severe calcific aortic stenosis with asymmetric septal hypertrophy with mild LV outflow tract obstruction.  There was normal left ventricular systolic function. There was mild mitral regurgitation.  The patient's chest, abdomen, both groins, and both lower extremities are prepared and draped in a sterile manner. A time out procedure is performed.  A median sternotomy  incision was performed and the left internal  mammary artery is dissected from the chest wall and prepared for bypass grafting. The left internal mammary artery is notably good quality conduit. Simultaneously, saphenous vein is obtained from the patient's right thigh using endoscopic vein harvest technique. The saphenous vein is notably fair quality conduit. After removal of the saphenous vein, the small surgical incisions in the lower extremity are closed with absorbable suture. Following systemic heparinization, the left internal mammary artery was transected distally noted to have excellent flow.  The pericardium is opened. The ascending aorta is Normal in appearance. The ascending aorta and the right atrium are cannulated for cardioplegia bypass.  Adequate heparinization is verified.    A retrograde cardioplegia cannula is placed through the right atrium into the coronary sinus.  The entire pre-bypass portion of the operation was notable for stable hemodynamics.  Cardiopulmonary bypass was begun and a left ventricular vent placed through the right superior pulmonary vein.  The surface of the heart inspected. Distal target vessels are selected for coronary artery bypass grafting. A cardioplegia cannula is placed in the ascending aorta.  A temperature probe was placed in the interventricular septum.  The patient is cooled to 32C systemic temperature.  The aortic cross clamp is applied and cold blood cardioplegia is delivered initially in an antegrade fashion through the aortic root.   Supplemental cardioplegia is given retrograde through the coronary sinus catheter.  Iced saline slush is applied for topical hypothermia.  The initial cardioplegic arrest is rapid with early diastolic arrest.  Repeat doses of cardioplegia are administered intermittently throughout the entire cross clamp portion of the operation through the aortic root, through the coronary sinus catheter, and through subsequently placed vein grafts in order to maintain completely flat  electrocardiogram and septal myocardial temperature below 15C.  Myocardial protection was felt to be excellent.  The following distal coronary artery bypass grafts were performed:   The posterior descending branch of the right coronary artery was grafted using a reversed saphenous vein graft in an end-to-side fashion.  At the site of distal anastomosis the target vessel was good quality and measured approximately 2.0 mm in diameter.  The distal left anterior coronary artery was grafted with the left internal mammary artery in an end-to-side fashion.  At the site of distal anastomosis the target vessel was good quality and measured approximately 1.8 mm in diameter.   An oblique transverse aortotomy incision was performed.  The aortic valve was inspected and notable for severe calcific aortic stenosis.  The aortic valve was tricuspid. There was significant hypertrophy of the interventricular septum with some narrowing of the left ventricular outflow tract.  The aortic valve leaflets were excised sharply and the aortic annulus decalcified.  Decalcification was notably straightforward.  The aortic annulus was sized to accept a 21 mm prosthesis.  The aortic root and left ventricle were irrigated with copious cold saline solution.  Septal myomectomy was performed using the technique originally described by Kateri Plummer. An 11 blade knife was utilized to resect a rectangular specimen of muscle beginning just below the aortic annulus along the mid portion of the right sinus of Valsalva and extending towards the commissure between the left and right sinus of Valsalva. Initially to a vertical longitudinal incisions were placed and these were connected with a transverse incision to resect the right angular specimen of muscle extending deep into the ventricular chamber. After completion of the myomectomy one could easily see directly into the middle of the left ventricular  chamber.  Aortic valve replacement was  performed using interrupted horizontal mattress 2-0 Ethibond pledgeted sutures with pledgets in the subannular position.  An [facility] Ease pericardial tissue valve (size 21 mm, model # 3300TFX, serial # [id number]) was implanted uneventfully. The valve seated appropriately with adequate space beneath the left main and right coronary artery.  The aortotomy was closed using a 2-layer closure of running 4-0 Prolene suture.  All proximal vein graft anastomoses were placed directly to the ascending aorta prior to removal of the aortic cross clamp.  The septal myocardial temperature rose rapidly after reperfusion of the left internal mammary artery graft.  One final dose of warm retrograde "hot shot" cardioplegia was administered through the coronary sinus catheter while all air was evacuated through the aortic root.  The aortic cross clamp was removed after a total cross clamp time of 104 minutes.  All proximal and distal coronary anastomoses were inspected for hemostasis and appropriate graft orientation. Epicardial pacing wires are fixed to the right ventricular outflow tract and to the right atrial appendage. The patient is rewarmed to 37C temperature. The aortic and left ventricular vents were removed.  The patient is weaned and disconnected from cardiopulmonary bypass.  The patient's rhythm at separation from bypass was AV paced.  The patient was weaned from cardioplegic bypass without any inotropic support. Total cardiopulmonary bypass time for the operation was 130 minutes.  Followup transesophageal echocardiogram performed after separation from bypass revealed a well-seated bioprosthetic tissue valve in the aortic position that was functioning normally. There was no aortic insufficiency. Left ventricular outflow tract appeared considerably larger with less narrowing do to the septal hypertrophy. Left ventricular function was preserved. No other abnormalities were noted.  The aortic and venous cannula  were removed uneventfully. Protamine was administered to reverse the anticoagulation. The mediastinum and pleural space were inspected for hemostasis and irrigated with saline solution. The mediastinum and the left pleural space were drained using 3 chest tubes placed through separate stab incisions inferiorly.  The soft tissues anterior to the aorta were reapproximated loosely. The sternum is closed with double strength sternal wire. The soft tissues anterior to the sternum were closed in multiple layers and the skin is closed with a running subcuticular skin closure.  The post-bypass portion of the operation was notable for stable rhythm and hemodynamics.  The patient was transfused one pack of adult platelets 22 thrombocytopenia with mild coagulopathy after separation from bypass.  No other blood products were administered during the operation.  The patient tolerated the procedure well and is transported to the surgical intensive care in stable condition. There are no intraoperative complications. All sponge instrument and needle counts are verified correct at completion of the operation.   Salvatore Decent. Cornelius Moras MD 07/22/2011 3:03 PM

## 2011-07-22 NOTE — Progress Notes (Addendum)
S/p AVR (tissue valve), septal myomectomy, and CABGx2  Vital Signs: Temp 97.7, HR 80, BP 118/66,  on vent and will attempt to wean shortly  Gttp: insulin, nitro at 45 mcg/min, dexmedetomidine 0.3 Chest tube output: 250 cc since surgery I/O: 4462/2425  Evening labs are pending.  Hopefully will be extubated soon. Continue routine post op care.

## 2011-07-23 ENCOUNTER — Inpatient Hospital Stay (HOSPITAL_COMMUNITY): Payer: Medicare Other

## 2011-07-23 LAB — PREPARE PLATELET PHERESIS: Unit division: 0

## 2011-07-23 LAB — POCT I-STAT, CHEM 8
BUN: 12 mg/dL (ref 6–23)
Chloride: 103 mEq/L (ref 96–112)
HCT: 25 % — ABNORMAL LOW (ref 39.0–52.0)
Potassium: 4.1 mEq/L (ref 3.5–5.1)
Sodium: 140 mEq/L (ref 135–145)

## 2011-07-23 LAB — CBC
HCT: 25.6 % — ABNORMAL LOW (ref 39.0–52.0)
Hemoglobin: 8.7 g/dL — ABNORMAL LOW (ref 13.0–17.0)
Hemoglobin: 9 g/dL — ABNORMAL LOW (ref 13.0–17.0)
MCH: 30.9 pg (ref 26.0–34.0)
MCH: 31.6 pg (ref 26.0–34.0)
MCHC: 34.5 g/dL (ref 30.0–36.0)
MCV: 89.8 fL (ref 78.0–100.0)
RBC: 2.85 MIL/uL — ABNORMAL LOW (ref 4.22–5.81)

## 2011-07-23 LAB — GLUCOSE, CAPILLARY
Glucose-Capillary: 100 mg/dL — ABNORMAL HIGH (ref 70–99)
Glucose-Capillary: 126 mg/dL — ABNORMAL HIGH (ref 70–99)
Glucose-Capillary: 72 mg/dL (ref 70–99)
Glucose-Capillary: 81 mg/dL (ref 70–99)
Glucose-Capillary: 94 mg/dL (ref 70–99)

## 2011-07-23 LAB — POCT I-STAT 3, ART BLOOD GAS (G3+)
Acid-base deficit: 3 mmol/L — ABNORMAL HIGH (ref 0.0–2.0)
Bicarbonate: 22.2 mEq/L (ref 20.0–24.0)
Bicarbonate: 24.1 mEq/L — ABNORMAL HIGH (ref 20.0–24.0)
O2 Saturation: 99 %
O2 Saturation: 99 %
Patient temperature: 37.8
TCO2: 25 mmol/L (ref 0–100)
pCO2 arterial: 38.8 mmHg (ref 35.0–45.0)
pO2, Arterial: 143 mmHg — ABNORMAL HIGH (ref 80.0–100.0)

## 2011-07-23 LAB — CREATININE, SERUM: GFR calc Af Amer: >90 mL/min (ref >90)

## 2011-07-23 LAB — TYPE AND SCREEN: Antibody Screen: NEGATIVE

## 2011-07-23 LAB — BASIC METABOLIC PANEL
Calcium: 7.8 mg/dL — ABNORMAL LOW (ref 8.4–10.5)
Creatinine, Ser: 0.62 mg/dL (ref 0.50–1.35)
GFR calc non Af Amer: >90 mL/min (ref >90)
Glucose, Bld: 190 mg/dL — ABNORMAL HIGH (ref 70–99)
Sodium: 140 mEq/L (ref 135–145)

## 2011-07-23 MED ORDER — FUROSEMIDE 10 MG/ML IJ SOLN
20.0000 mg | Freq: Four times a day (QID) | INTRAMUSCULAR | Status: AC
  Administered 2011-07-23 (×3): 20 mg via INTRAVENOUS
  Filled 2011-07-23 (×3): qty 2

## 2011-07-23 MED ORDER — VITAMIN B-6 50 MG PO TABS
50.0000 mg | ORAL_TABLET | Freq: Every day | ORAL | Status: DC
  Administered 2011-07-23 – 2011-07-26 (×4): 50 mg via ORAL
  Filled 2011-07-23 (×4): qty 1

## 2011-07-23 MED ORDER — POTASSIUM CHLORIDE 10 MEQ/50ML IV SOLN
10.0000 meq | INTRAVENOUS | Status: AC
  Administered 2011-07-23 (×2): 10 meq via INTRAVENOUS
  Filled 2011-07-23: qty 100

## 2011-07-23 MED ORDER — POTASSIUM CHLORIDE 10 MEQ/50ML IV SOLN
10.0000 meq | INTRAVENOUS | Status: AC | PRN
  Administered 2011-07-23 (×3): 10 meq via INTRAVENOUS
  Filled 2011-07-23: qty 100

## 2011-07-23 MED ORDER — MORPHINE SULFATE 2 MG/ML IJ SOLN: 1.0000 mg | INTRAMUSCULAR | Status: DC | PRN

## 2011-07-23 MED ORDER — MIDAZOLAM HCL 2 MG/2ML IJ SOLN
1.0000 mg | Freq: Once | INTRAMUSCULAR | Status: AC
  Administered 2011-07-23: 1 mg via INTRAVENOUS
  Filled 2011-07-23: qty 2

## 2011-07-23 MED ORDER — INSULIN ASPART 100 UNIT/ML ~~LOC~~ SOLN
0.0000 [IU] | SUBCUTANEOUS | Status: DC
  Administered 2011-07-23 – 2011-07-24 (×3): 2 [IU] via SUBCUTANEOUS

## 2011-07-23 NOTE — Progress Notes (Addendum)
At 0000 on 7/11/ 13: Patient arouses easily. Follows commands: sticks out tongue; lifts head off pillow; moves extremities. Is not breathing over ventilator when resting. Will continue to arouse patient and will attempt to wean again when he is breathing over the ventilator. Thresa Ross RN

## 2011-07-23 NOTE — Progress Notes (Signed)
   CARDIOTHORACIC SURGERY PROGRESS NOTE   R1 Day Post-Op Procedure(s) (LRB): AORTIC VALVE REPLACEMENT (AVR)/CORONARY ARTERY BYPASS GRAFTING (CABG) (N/A)  Subjective: No complaints other than it took a long time for him to fully wake up after surgery.  Still feels a little sleepy.  Mild soreness in chest.  No SOB.  No nausea.  Objective: Vital signs: BP Readings from Last 1 Encounters:  07/23/11 113/51   Pulse Readings from Last 1 Encounters:  07/23/11 89   Resp Readings from Last 1 Encounters:  07/23/11 12   Temp Readings from Last 1 Encounters:  07/23/11 99.3 F (37.4 C) Core (Comment)    Hemodynamics: PAP: (15-28)/(5-15) 26/12 mmHg CO:  [2.8 L/min-5.7 L/min] 4.4 L/min CI:  [1.5 L/min/m2-3 L/min/m2] 2.3 L/min/m2  Physical Exam:  Rhythm:   sinus  Breath sounds: clear  Heart sounds:  RRR with systolic murmur LSB  Incisions:  Dressings dry  Abdomen:  soft  Extremities:  Warm, well perfused   Intake/Output from previous day: 07/10 0701 - 07/11 0700 In: 6306.1 [I.V.:3852.1; Blood:704; IV Piggyback:1750] Out: 5110 [Urine:3350; Blood:1200; Chest Tube:560] Intake/Output this shift: Total I/O In: 83 [P.O.:60; I.V.:23] Out: 55 [Urine:25; Chest Tube:30]  Lab Results:  [facility] 07/23/11 0410 07/22/11 2112 07/22/11 2100  WBC 10.5 -- 13.8*  HGB 8.7* 8.5* --  HCT 25.2* 25.0* --  PLT 121* -- 118*   BMET:  Basename 07/23/11 0410 07/22/11 2112 07/20/11 1300  NA 140 141 --  K 3.6 4.0 --  CL 106 109 --  CO2 23 -- 26  GLUCOSE 190* 167* --  BUN 10 9 --  CREATININE 0.62 0.60 --  CALCIUM 7.8* -- 9.7    CBG (last 3)   Basename 07/23/11 0758 07/23/11 0431 07/23/11 0010  GLUCAP 162* 174* 157*   ABG    Component Value Date/Time   PHART 7.395 07/23/2011 0415   HCO3 24.1* 07/23/2011 0415   TCO2 25 07/23/2011 0415   ACIDBASEDEF 1.0 07/23/2011 0415   O2SAT 99.0 07/23/2011 0415   CXR: clear  Assessment/Plan: S/P Procedure(s) (LRB): AORTIC VALVE REPLACEMENT  (AVR)/CORONARY ARTERY BYPASS GRAFTING (CABG) (N/A)  Doing well POD1 Expected post op acute blood loss anemia, mild, stable Expected post op volume excess, mild Post op thrombocytopenia, mild   Mobilize  D/C tubes and lines  Diuresis   OWEN,CLARENCE H 07/23/2011 9:05 AM

## 2011-07-23 NOTE — Progress Notes (Signed)
Patient more alert and follows commands. Beginning to breathe more over the ventilator. Will have respiratory attempt to wean again. A Haggard RN

## 2011-07-23 NOTE — Care Management Note (Unsigned)
    Page 1 of 1   07/23/2011     3:18:30 PM   CARE MANAGEMENT NOTE 07/23/2011  Patient:  Stephen Mendoza, Stephen Mendoza   Account Number:  [id number]  Date Initiated:  07/23/2011  Documentation initiated by:  Lineth Thielke  Subjective/Objective Assessment:   PT S/P AVR AND CABG X2 ON 07/22/11.  PTA , PT INDEPENDENT, LIVES WITH SPOUSE.     Action/Plan:   PT HAS SUPPORTIVE WIFE, SON, AND DTR TO PROVIDE CARE AT DC. WILL FOLLOW FOR HOME NEEDS AS PT PROGRESSES.   Anticipated DC Date:  07/27/2011   Anticipated DC Plan:  HOME W HOME HEALTH SERVICES         Choice offered to / List presented to:             Status of service:  In process, will continue to follow Medicare Important Message given?   (If response is "NO", the following Medicare IM given date fields will be blank) Date Medicare IM given:   Date Additional Medicare IM given:    Discharge Disposition:    Per UR Regulation:    If discussed at Long Length of Stay Meetings, dates discussed:    Comments:

## 2011-07-23 NOTE — Procedures (Signed)
Extubation Procedure Note  Patient Details:   Name: Stephen Mendoza DOB: 1940-11-11 MRN: [id number]   Airway Documentation:  Airway 8.5 mm (Active)  Secured at (cm) 22 cm 07/23/2011  2:05 AM  Measured From Lips 07/23/2011  2:05 AM  Secured Location Right 07/23/2011  2:05 AM  Secured By Pink Tape 07/23/2011  2:05 AM  Site Condition Dry 07/23/2011  2:05 AM    Evaluation  O2 sats: stable throughout Complications: No apparent complications Patient did tolerate procedure well. Bilateral Breath Sounds: Clear;Diminished   Yes   Pt. Was extubated to a 4L [place] without any complications, dyspnea or stridor noted. Pt. Achieved a goal of 1.5L on VC and -20 on NIF. Pt. Was instructed on IS X 5. Highest goal reached was .  Sheryle Hail 07/23/2011, 02:58 AM

## 2011-07-23 NOTE — Progress Notes (Signed)
POD # 1 AVR, CABG  Comfortable BP 135/65  Pulse 83  Temp 99.6 F (37.6 C) (Oral)  Resp 16  Wt 179 lb 7.3 oz (81.4 kg)  SpO2 98%  Intake/Output Summary (Last 24 hours) at 07/23/11 1743 Last data filed at 07/23/11 1700  Gross per 24 hour  Intake 2206.9 ml  Output   2005 ml  Net  201.9 ml   K 4.1, creatinine 0.7 hct 25  Stable POD #1

## 2011-07-24 ENCOUNTER — Encounter (HOSPITAL_COMMUNITY): Payer: Self-pay | Admitting: General Practice

## 2011-07-24 ENCOUNTER — Inpatient Hospital Stay (HOSPITAL_COMMUNITY): Payer: Medicare Other

## 2011-07-24 LAB — CBC
MCV: 90.4 fL (ref 78.0–100.0)
Platelets: 90 10*3/uL — ABNORMAL LOW (ref 150–400)
RBC: 2.61 MIL/uL — ABNORMAL LOW (ref 4.22–5.81)
RDW: 14.1 % (ref 11.5–15.5)
WBC: 11.9 10*3/uL — ABNORMAL HIGH (ref 4.0–10.5)

## 2011-07-24 LAB — BASIC METABOLIC PANEL
CO2: 27 mEq/L (ref 19–32)
Calcium: 8.1 mg/dL — ABNORMAL LOW (ref 8.4–10.5)
GFR calc Af Amer: >90 mL/min (ref >90)
GFR calc non Af Amer: >90 mL/min (ref >90)
Sodium: 136 mEq/L (ref 135–145)

## 2011-07-24 LAB — GLUCOSE, CAPILLARY: Glucose-Capillary: 121 mg/dL — ABNORMAL HIGH (ref 70–99)

## 2011-07-24 MED ORDER — POTASSIUM CHLORIDE CRYS ER 20 MEQ PO TBCR
20.0000 meq | EXTENDED_RELEASE_TABLET | Freq: Every day | ORAL | Status: DC
  Administered 2011-07-25 – 2011-07-26 (×2): 20 meq via ORAL
  Filled 2011-07-24 (×2): qty 1

## 2011-07-24 MED ORDER — TRAMADOL HCL 50 MG PO TABS
50.0000 mg | ORAL_TABLET | ORAL | Status: DC | PRN
  Administered 2011-07-25: 100 mg via ORAL
  Filled 2011-07-24: qty 2

## 2011-07-24 MED ORDER — SODIUM CHLORIDE 0.9 % IV SOLN: 250.0000 mL | INTRAVENOUS | Status: DC | PRN

## 2011-07-24 MED ORDER — FUROSEMIDE 10 MG/ML IJ SOLN
20.0000 mg | Freq: Four times a day (QID) | INTRAMUSCULAR | Status: AC
  Administered 2011-07-24 (×2): 20 mg via INTRAVENOUS
  Filled 2011-07-24 (×3): qty 2

## 2011-07-24 MED ORDER — SODIUM CHLORIDE 0.9 % IJ SOLN: 3.0000 mL | INTRAMUSCULAR | Status: DC | PRN

## 2011-07-24 MED ORDER — FUROSEMIDE 40 MG PO TABS
40.0000 mg | ORAL_TABLET | Freq: Every day | ORAL | Status: DC
  Administered 2011-07-25 – 2011-07-26 (×2): 40 mg via ORAL
  Filled 2011-07-24 (×2): qty 1

## 2011-07-24 MED ORDER — MOVING RIGHT ALONG BOOK
Freq: Once | Status: AC
  Administered 2011-07-24: 12:00:00
  Filled 2011-07-24: qty 1

## 2011-07-24 MED ORDER — LOSARTAN POTASSIUM 50 MG PO TABS
50.0000 mg | ORAL_TABLET | Freq: Every day | ORAL | Status: DC
  Administered 2011-07-24 – 2011-07-26 (×3): 50 mg via ORAL
  Filled 2011-07-24 (×3): qty 1

## 2011-07-24 MED ORDER — SODIUM CHLORIDE 0.9 % IJ SOLN
3.0000 mL | Freq: Two times a day (BID) | INTRAMUSCULAR | Status: DC
  Administered 2011-07-24 – 2011-07-26 (×5): 3 mL via INTRAVENOUS

## 2011-07-24 MED ORDER — POTASSIUM CHLORIDE 10 MEQ/50ML IV SOLN
10.0000 meq | INTRAVENOUS | Status: AC | PRN
  Administered 2011-07-24 (×3): 10 meq via INTRAVENOUS

## 2011-07-24 MED ORDER — POTASSIUM CHLORIDE 10 MEQ/50ML IV SOLN
INTRAVENOUS | Status: AC
  Administered 2011-07-24: 10 meq via INTRAVENOUS
  Filled 2011-07-24: qty 150

## 2011-07-24 MED FILL — Sodium Chloride Irrigation Soln 0.9%: Qty: 3000 | Status: AC

## 2011-07-24 MED FILL — Mannitol IV Soln 20%: INTRAVENOUS | Qty: 500 | Status: AC

## 2011-07-24 MED FILL — Electrolyte-R (PH 7.4) Solution: INTRAVENOUS | Qty: 4000 | Status: AC

## 2011-07-24 MED FILL — Lidocaine HCl IV Inj 20 MG/ML: INTRAVENOUS | Qty: 5 | Status: AC

## 2011-07-24 MED FILL — Sodium Chloride IV Soln 0.9%: INTRAVENOUS | Qty: 1000 | Status: AC

## 2011-07-24 NOTE — Plan of Care (Signed)
Problem: Phase III Progression Outcomes Goal: Time patient transferred to PCTU/Telemetry POD Outcome: Completed/Met Date Met:  07/24/11 1240

## 2011-07-24 NOTE — Progress Notes (Signed)
CARDIAC REHAB PHASE I   PRE:  Rate/Rhythm: 86 SR    BP: sitting 120/60    SaO2: 95 RA  MODE:  Ambulation: 500 ft   POST:  Rate/Rhythm: 100 ST    BP: sitting 130/64     SaO2: 95 RA  Tolerated well. Strong. Used RW with assist x1. Try without RW tomorrow. To chair. [id number]  Harriet Masson CES, ACSM

## 2011-07-24 NOTE — Progress Notes (Signed)
   CARDIOTHORACIC SURGERY PROGRESS NOTE   R2 Days Post-Op Procedure(s) (LRB): AORTIC VALVE REPLACEMENT (AVR)/CORONARY ARTERY BYPASS GRAFTING (CABG) (N/A)  Subjective: Looks good.  Minimal soreness.  No shortness of breath.  Marginal appetite but otherwise feels well.  Objective: Vital signs: BP Readings from Last 1 Encounters:  07/24/11 120/66   Pulse Readings from Last 1 Encounters:  07/24/11 73   Resp Readings from Last 1 Encounters:  07/24/11 14   Temp Readings from Last 1 Encounters:  07/24/11 99.7 F (37.6 C) Oral    Hemodynamics: PAP: (30)/(15) 30/15 mmHg  Physical Exam:  Rhythm:   sinus  Breath sounds: clear  Heart sounds:  RRR w/ systolic murmur LLSB  Incisions:  Dressings dry  Abdomen:  soft  Extremities:  warm   Intake/Output from previous day: 07/11 0701 - 07/12 0700 In: 1008 [P.O.:420; I.V.:383; IV Piggyback:205] Out: 2400 [Urine:2330; Chest Tube:70] Intake/Output this shift:    Lab Results:  Basename 07/24/11 0400 07/23/11 1710 07/23/11 1700  WBC 11.9* -- 11.5*  HGB 8.2* 8.5* --  HCT 23.6* 25.0* --  PLT 90* -- 107*   BMET:  Basename 07/24/11 0400 07/23/11 1710 07/23/11 0410  NA 136 140 --  K 3.7 4.1 --  CL 100 103 --  CO2 27 -- 23  GLUCOSE 130* 129* --  BUN 12 12 --  CREATININE 0.60 0.70 --  CALCIUM 8.1* -- 7.8*    CBG (last 3)   Basename 07/24/11 0725 07/24/11 0413 07/23/11 2345  GLUCAP 116* 121* 126*   ABG    Component Value Date/Time   PHART 7.395 07/23/2011 0415   HCO3 24.1* 07/23/2011 0415   TCO2 24 07/23/2011 1710   ACIDBASEDEF 1.0 07/23/2011 0415   O2SAT 99.0 07/23/2011 0415   CXR: clear  Assessment/Plan: S/P Procedure(s) (LRB): AORTIC VALVE REPLACEMENT (AVR)/CORONARY ARTERY BYPASS GRAFTING (CABG) (N/A)  Doing well POD2 Expected post op acute blood loss anemia, mild, stable Expected post op volume excess, mild, diuresing Postop thrombocytopenia, mild, stable   Mobilize  Diuresis  Transfer 2000  Restart ARB  at 1/2 home dose for now and increase if BP will tolerate  Possible d/c home 2-3 days   OWEN,CLARENCE H 07/24/2011 8:51 AM

## 2011-07-24 NOTE — Progress Notes (Signed)
Pt transferred to 2031 while on telemetry, pt tolerated well. Report given to [facility].

## 2011-07-25 ENCOUNTER — Inpatient Hospital Stay (HOSPITAL_COMMUNITY): Payer: Medicare Other

## 2011-07-25 LAB — BASIC METABOLIC PANEL
Calcium: 8.5 mg/dL (ref 8.4–10.5)
GFR calc Af Amer: >90 mL/min (ref >90)
GFR calc non Af Amer: >90 mL/min (ref >90)
Sodium: 140 mEq/L (ref 135–145)

## 2011-07-25 LAB — CBC
MCH: 30.1 pg (ref 26.0–34.0)
MCHC: 33.1 g/dL (ref 30.0–36.0)
Platelets: 102 10*3/uL — ABNORMAL LOW (ref 150–400)
RBC: 2.69 MIL/uL — ABNORMAL LOW (ref 4.22–5.81)
RDW: 13.8 % (ref 11.5–15.5)

## 2011-07-25 MED ORDER — METOPROLOL TARTRATE 25 MG PO TABS
25.0000 mg | ORAL_TABLET | Freq: Two times a day (BID) | ORAL | Status: DC
  Administered 2011-07-25 – 2011-07-26 (×3): 25 mg via ORAL
  Filled 2011-07-25 (×5): qty 1

## 2011-07-25 NOTE — Progress Notes (Signed)
CARDIAC REHAB PHASE I   PRE:  Rate/Rhythm: 83SR  BP:  Supine:   Sitting: 118/60  Standing:    SaO2: 92%RA  MODE:  Ambulation: 540  ft   POST:  Rate/Rhythem: 90  BP:  Supine:   Sitting: 126/62  Standing:    SaO2: 94% RA   1010 - 1110 Pt ambulated with RW and 1 assist, tolerated well. Education done for possible d/c tomorrow. Pt agrees to out pt rehab at [place].   Rosalie Doctor

## 2011-07-25 NOTE — Progress Notes (Addendum)
3 Days Post-Op Procedure(s) (LRB): AORTIC VALVE REPLACEMENT (AVR)/CORONARY ARTERY BYPASS GRAFTING (CABG) (N/A) Subjective: Stephen Mendoza is without complaints this morning.  He looks great.  Ambulating without difficulty +BM  Objective: Vital signs in last 24 hours: Temp:  [98.6 F (37 C)-100.3 F (37.9 C)] 99.9 F (37.7 C) (07/13 0449) Pulse Rate:  [73-103] 97  (07/13 0449) Cardiac Rhythm:  [-] Normal sinus rhythm (07/12 1955) Resp:  [14-19] 19  (07/13 0449) BP: (120-144)/(61-77) 144/70 mmHg (07/13 0449) SpO2:  [93 %-100 %] 97 % (07/13 0449) Weight:  [178 lb 12.7 oz (81.1 kg)-179 lb 0.2 oz (81.2 kg)] 179 lb 0.2 oz (81.2 kg) (07/13 0449)  Intake/Output from previous day: 07/12 0701 - 07/13 0700 In: 120 [P.O.:120] Out: 1875 [Urine:1875]  General appearance: alert, cooperative and no distress Heart: regular rate and rhythm Lungs: clear to auscultation bilaterally Abdomen: soft, non-tender; bowel sounds normal; no masses,  no organomegaly Extremities: edema trace Wound: clean and dry  Lab Results:  Basename 07/24/11 0400 07/23/11 1710 07/23/11 1700  WBC 11.9* -- 11.5*  HGB 8.2* 8.5* --  HCT 23.6* 25.0* --  PLT 90* -- 107*   BMET:  Basename 07/24/11 0400 07/23/11 1710 07/23/11 0410  NA 136 140 --  K 3.7 4.1 --  CL 100 103 --  CO2 27 -- 23  GLUCOSE 130* 129* --  BUN 12 12 --  CREATININE 0.60 0.70 --  CALCIUM 8.1* -- 7.8*    PT/INR:  Basename 07/22/11 1520  LABPROT 18.7*  INR 1.53*   ABG    Component Value Date/Time   PHART 7.395 07/23/2011 0415   HCO3 24.1* 07/23/2011 0415   TCO2 24 07/23/2011 1710   ACIDBASEDEF 1.0 07/23/2011 0415   O2SAT 99.0 07/23/2011 0415   CBG (last 3)   Basename 07/24/11 1200 07/24/11 0725 07/24/11 0413  GLUCAP 99 116* 121*    Assessment/Plan: S/P Procedure(s) (LRB): AORTIC VALVE REPLACEMENT (AVR)/CORONARY ARTERY BYPASS GRAFTING (CABG) (N/A)  1. CV- NSR with occasional PVC's- rate in the 90s, pressure running 140s, will increase  Lopressor to 25mg  BID, will continue Cozaar at 50mg  daily 2. Pulm- no acute issues, continue IS 3. Acute post operative anemia- stable 4. Dispo- patient doing well, will increase Lopressor to 25mg  BID, D/C EPW, possible d/c home in AM    LOS: 3 days    [name] 07/25/2011    Chart reviewed, patient examined, agree with above.

## 2011-07-26 MED ORDER — TRAMADOL HCL 50 MG PO TABS: 50.0000 mg | ORAL_TABLET | Freq: Four times a day (QID) | ORAL | Status: AC | PRN

## 2011-07-26 MED ORDER — POTASSIUM CHLORIDE CRYS ER 20 MEQ PO TBCR: 20.0000 meq | EXTENDED_RELEASE_TABLET | Freq: Every day | ORAL | Status: DC

## 2011-07-26 MED ORDER — METOPROLOL TARTRATE 25 MG PO TABS: 25.0000 mg | ORAL_TABLET | Freq: Two times a day (BID) | ORAL | Status: DC

## 2011-07-26 MED ORDER — SIMVASTATIN 10 MG PO TABS: 10.0000 mg | ORAL_TABLET | Freq: Every day | ORAL | Status: DC

## 2011-07-26 MED ORDER — ASPIRIN 325 MG PO TBEC: 325.0000 mg | DELAYED_RELEASE_TABLET | Freq: Every day | ORAL | Status: AC

## 2011-07-26 NOTE — Progress Notes (Signed)
This nurse observed james RN removing the external pacing wires and chest tube sutures without any difficulty. Patient tolerated well. Has been placed on telemon for vital signs per protocol. Lester Kinsman rn, bsn

## 2011-07-26 NOTE — Discharge Summary (Signed)
Physician Discharge Summary  Patient ID: Stephen Mendoza MRN: [id number] DOB/AGE: 69/31/1942 69 y.o.  Admit date: 07/22/2011 Discharge date: 07/26/2011  Admission Diagnoses:  Patient Active Problem List  Diagnosis  . Aortic stenosis  . CAD (coronary artery disease)  . AS (aortic stenosis)  . Benign prostatic hypertrophy (BPH) with nocturia   Discharge Diagnoses:   Patient Active Problem List  Diagnosis  . Aortic stenosis  . CAD (coronary artery disease)  . AS (aortic stenosis)  . Benign prostatic hypertrophy (BPH) with nocturia  . S/P aortic valve replacement with bioprosthetic valve  . S/P CABG x 2   Discharged Condition: good  History of Present Illness:   Stephen Mendoza is a 57 year old white male with known history of Aortic stenosis and hypertension.  He was originally diagnosed with a heart murmur over 40 years prior.  He was first diagnosed with Aortic stenosis after being referred by his PCP to a Cardiologist about 4 years ago.  The patient was told at that time the he would require Aortic Valve Replacement in the future.  However the patient was asymptomatic at that time.  Approximately 2 years ago he began to experience occasional episodes of chest discomfort associated with strenuous physical activity.  The patient described the pain as being a cold sensation in his upper chest and throat.  He was again evaluated by a Cardiologist who recommended the patient undergo aortic valve replacement at that time.  The patient however elected to hold off on surgery and any further evaluation at that time.  Recently patient has developed progressive mild exertional shortness of breath and fatigue.  These symptoms all relieve with rest, however he feels his exercise tolerance level has decreased.  He was referred to Dr. Melburn Popper who performed an echocardiogram which confirmed the presence of severe aortic stenosis with a preserved LE function.  He also underwent cardiac catheterization  which showed 2 vessel CAD.  Due to these findings the patient was referred to [facility] for possible surgical intervention.  The patient was evaluated by Dr. Cornelius Moras on 06/19/2011 who evaluated the patient and after reviewing of the imaging studies felt the patient would be a good surgical candidate.  However, before surgery could be performed the patient would need to be evaluated by a dentist to ensure no acute dental infections were present hindering placement of a prosthetic aortic valve.  The patient completed the necessary dental clearance and again followed up with Dr. Cornelius Moras on 07/20/2011 at which time Dr. Cornelius Moras felt it would be appropriate to proceed with surgery.  The risks and benefits of the procedure were explained to the patient and he was agreeable to proceed.  He was set up for an CABG, Aortic Valve replacement, and possible Septal Myomectomy for 07/22/2011.  Hospital Course:   Mr. [name] presented to [facility] on 07/22/2011.  He was taken to the operating room and underwent CABG x2 LIMA to LAD, SVG to PDA, Endoscopic Saphenous Vein Harvest of the Right Thigh, Aortic Valve Replacement with a 21mm Edwards Magna Ease Pericardial Tissue Valve, and a Septal Myomectomy.  The patient tolerated the procedure well and was taken to the SICU in stable condition.  POD #1 the patient was extubated.  The patient's chest tubes and arterial lines were removed without difficulty.  POD #2 the patient's chest xray did not reveal any evidence of pneumothorax.  He was hypertensive and started on a lower dose of his home Cozaar.  He was medically stable  and transferred to the step down unit.  POD #3 the patients pacing wires were removed without difficulty.  POD #4 the patient is doing well.  He continue to have some hypertension and was placed on his full home dose of Cozaar.  The patient did question the need for all of his medication at discharge stating he would like to be on as little medication as possible.  The  importance of each medication was explained to the patient and he was agreeable to take his medications regularly. He will be discharged home today.  He will need to follow up with Dr. Cornelius Moras in 3 weeks with a chest xray prior to his appointment.  Our office will contact the patient with his appointment date and time. He will also need to follow up with Dr. Melburn Popper in 2 weeks.   Significant Diagnostic Studies: Cardiac Catheterization  RA: 10/8/6  RV: 33/4/7  PCWP: 17/15/13  PA: 31/14/20  Cardiac Output  Thermodilution: 4.6 lpm, index of 2.4  Fick : 4.9 lpm , index of 2.5  Arterial Sat: 93  PA Sat: 65.  LV pressure: no obtained, did not cross the aortic valve  Aortic pressure: 141/75/101  Angiography  Left Main: smooth and normal  Left anterior Descending: The LAD gives off an early 1st diagonal. This 1st diagonal has a long diffuse 60-70% stenosis. The mid LAD has a 50-60 segmental stenosis in the proximal segment. The 2nd diagonal has a 20% stenosis at it's takeoff. The distal LAD has a 20-30 % stenosis.  Left Circumflex: The LCx is a moderate - large vessel. It gives off several moderate sized marginal's that are fairly normal.  Right Coronary Artery: Large and dominant. The prox / mid vessel has a long lesion that ranges from 50-60 % in severity. The distal RCA has minor luminal irregularities. The PDA and PLSA are normal.   ECHO:   ------------------------------------------------------------ Transthoracic Echocardiography  Patient: Stephen Mendoza MR #: [id number] Study Date: 05/28/2011 Gender: M Age: 81 Height: 180.3cm Weight: 80.7kg BSA: 2.72m^2 Pt. Status: Room:  ATTENDING Nahser, Isa Rankin Nahser, Alvia Grove Nahser, Philip PERFORMING Redge Gainer, Site 3 SONOGRAPHER Junious Dresser, RDCS cc:  ------------------------------------------------------------ LV EF: 60% - 65%  ------------------------------------------------------------ Indications: Aortic  stenosis 424.1.  ------------------------------------------------------------ History: PMH: Acquired from the patient and from the patient's chart. Palpitations. Severe aortic stenosis. Mild mitral regurgitation. Risk factors: Former tobacco use. Hypertension.  ------------------------------------------------------------ Study Conclusions  - Left ventricle: The cavity size was normal. Wall thickness was increased in a pattern of moderate LVH. Systolic function was normal. The estimated ejection fraction was in the range of 60% to 65%. Doppler parameters are consistent with abnormal left ventricular relaxation (grade 1 diastolic dysfunction). - Aortic valve: AV is difficult to see well. It is thickened, calcified with restricted motion. Peak and mean gradients through the valve are112 and 57 mm mm Hg respectively consistent with critical AS. Trivial regurgitation. Mean gradient: 57mm Hg (S). Peak gradient: Hg (S). - Mitral valve: Mild regurgitation. - Pulmonary arteries: PA peak pressure: 34mm Hg (S).  Treatments: surgery:    Aortic Valve Replacement  Edwards Magna Ease Pericardial Tissue Valve (size 21mm, model # 3300TFX, serial # [id number]) Septal Myomectomy  Coronary Artery Bypass Grafting x 2  Left Internal Mammary Artery to Distal Left Anterior Descending Coronary Artery  Saphenous Vein Graft to Posterior Descending Coronary Artery  Endoscopic Vein Harvest from Right Thigh  Disposition: 01-Home or Self Care   Medication List  As of 07/26/2011  10:43 AM   STOP taking these medications         aspirin 81 MG tablet         TAKE these medications         aspirin 325 MG EC tablet   Take 1 tablet (325 mg total) by mouth daily.      Co Q 10 100 MG Caps   Take 100 mg by mouth 2 (two) times daily.      FISH OIL PO   Take 1,600 mg by mouth 2 (two) times daily.      HAWTHORNE PO   Take 300 mg by mouth 2 (two) times daily.      losartan-hydrochlorothiazide  100-25 MG per tablet   Commonly known as: HYZAAR   Take 1 tablet by mouth daily.      metoprolol tartrate 25 MG tablet   Commonly known as: LOPRESSOR   Take 1 tablet (25 mg total) by mouth 2 (two) times daily.      multivitamin capsule   Take 1 capsule by mouth daily.      potassium chloride SA 20 MEQ tablet   Commonly known as: K-DUR,KLOR-CON   Take 1 tablet (20 mEq total) by mouth daily. For 3 days      pyridOXINE 50 MG tablet   Commonly known as: B-6   Take 50 mg by mouth daily.      simvastatin 10 MG tablet   Commonly known as: ZOCOR   Take 1 tablet (10 mg total) by mouth at bedtime.      traMADol 50 MG tablet   Commonly known as: ULTRAM   Take 1-2 tablets (50-100 mg total) by mouth every 6 (six) hours as needed.           Follow-up Information    Follow up with Purcell Nails, MD in 3 weeks. (Office will contact you with appointment)    Contact information:   [address] [place] [address] [phone]       Follow up with [facility] in 3 weeks. (Please get chest xray performed 1 hour prior to your post operative appointment)       Schedule an appointment as soon as possible for a visit with Elyn Aquas., MD. (Please contact office and make appointment for 2 weeks time)    Contact information:   [address] [place] [address] [phone]       Follow up with Judge Stall, MD. (Please contact office for 2 week follow up )          Signed: [name] 07/26/2011, 10:43 AM

## 2011-07-26 NOTE — Progress Notes (Signed)
4 Days Post-Op Procedure(s) (LRB): AORTIC VALVE REPLACEMENT (AVR)/CORONARY ARTERY BYPASS GRAFTING (CABG) (N/A) Subjective:  Mr. Barberi has no complaints this morning.  He states he feels ready to be discharged home.  Objective: Vital signs in last 24 hours: Temp:  [98.9 F (37.2 C)-99.1 F (37.3 C)] 98.9 F (37.2 C) (07/14 0528) Pulse Rate:  [85-94] 88  (07/14 0528) Cardiac Rhythm:  [-] Normal sinus rhythm (07/13 1945) Resp:  [18-20] 18  (07/14 0528) BP: (114-137)/(68-81) 137/81 mmHg (07/14 0528) SpO2:  [94 %-97 %] 94 % (07/14 0528) Weight:  [173 lb 4.8 oz (78.608 kg)] 173 lb 4.8 oz (78.608 kg) (07/14 0528) Intake/Output from previous day: 07/13 0701 - 07/14 0700 In: 520 [P.O.:520] Out: 1300 [Urine:1300]  General appearance: alert, cooperative and no distress Neurologic: intact Heart: regular rate and rhythm Lungs: clear to auscultation bilaterally Abdomen: soft, non-tender; bowel sounds normal; no masses,  no organomegaly Extremities: edema trace Wound: clean and dry  Lab Results:  Basename 07/25/11 0700 07/24/11 0400  WBC 10.7* 11.9*  HGB 8.1* 8.2*  HCT 24.5* 23.6*  PLT 102* 90*   BMET:  Basename 07/25/11 0700 07/24/11 0400  NA 140 136  K 3.3* 3.7  CL 102 100  CO2 29 27  GLUCOSE 115* 130*  BUN 14 12  CREATININE 0.59 0.60  CALCIUM 8.5 8.1*    PT/INR: No results found for this basename: LABPROT,INR in the last 72 hours ABG    Component Value Date/Time   PHART 7.395 07/23/2011 0415   HCO3 24.1* 07/23/2011 0415   TCO2 24 07/23/2011 1710   ACIDBASEDEF 1.0 07/23/2011 0415   O2SAT 99.0 07/23/2011 0415   CBG (last 3)   Basename 07/24/11 1200 07/24/11 0725 07/24/11 0413  GLUCAP 99 116* 121*    Assessment/Plan: S/P Procedure(s) (LRB): AORTIC VALVE REPLACEMENT (AVR)/CORONARY ARTERY BYPASS GRAFTING (CABG) (N/A)  1. CV- episode of WCT yesterday,  rate controlled, pressure remains elevated will resume home Cozaar dose 2. Pulm- no acute issues continues IS 3.  Hypokalemia- will continue supplementation for 3 days at discharge 4. Acute post operative anemia- stable 5. Dispo- patient progressing well, will plan for d/c home today    LOS: 4 days    Raford Pitcher, Phillp Dolores 07/26/2011

## 2011-08-05 ENCOUNTER — Encounter: Payer: Self-pay | Admitting: Cardiovascular Disease

## 2011-08-10 ENCOUNTER — Other Ambulatory Visit: Payer: Self-pay | Admitting: Thoracic Surgery (Cardiothoracic Vascular Surgery)

## 2011-08-10 DIAGNOSIS — I2510 Atherosclerotic heart disease of native coronary artery without angina pectoris: Secondary | ICD-10-CM

## 2011-08-11 ENCOUNTER — Other Ambulatory Visit: Payer: Self-pay | Admitting: Thoracic Surgery (Cardiothoracic Vascular Surgery)

## 2011-08-11 DIAGNOSIS — I2510 Atherosclerotic heart disease of native coronary artery without angina pectoris: Secondary | ICD-10-CM

## 2011-08-17 ENCOUNTER — Ambulatory Visit (INDEPENDENT_AMBULATORY_CARE_PROVIDER_SITE_OTHER): Payer: Medicare Other | Admitting: Nurse Practitioner

## 2011-08-17 ENCOUNTER — Ambulatory Visit
Admission: RE | Admit: 2011-08-17 | Discharge: 2011-08-17 | Disposition: A | Discharge status: Home / Self Care | Payer: Medicare Other | Source: Ambulatory Visit | Attending: Thoracic Surgery (Cardiothoracic Vascular Surgery) | Admitting: Thoracic Surgery (Cardiothoracic Vascular Surgery)

## 2011-08-17 ENCOUNTER — Ambulatory Visit (INDEPENDENT_AMBULATORY_CARE_PROVIDER_SITE_OTHER): Payer: Self-pay | Admitting: Physician Assistant

## 2011-08-17 ENCOUNTER — Encounter: Payer: Self-pay | Admitting: Nurse Practitioner

## 2011-08-17 VITALS — BP 155/84 | HR 90 | Resp 18 | Ht 68.0 in | Wt 170.0 lb

## 2011-08-17 VITALS — BP 138/70 | HR 94 | Ht 68.0 in | Wt 170.4 lb

## 2011-08-17 DIAGNOSIS — Z951 Presence of aortocoronary bypass graft: Secondary | ICD-10-CM

## 2011-08-17 DIAGNOSIS — Z952 Presence of prosthetic heart valve: Secondary | ICD-10-CM

## 2011-08-17 DIAGNOSIS — Z9889 Other specified postprocedural states: Secondary | ICD-10-CM

## 2011-08-17 DIAGNOSIS — I10 Essential (primary) hypertension: Secondary | ICD-10-CM

## 2011-08-17 DIAGNOSIS — Z953 Presence of xenogenic heart valve: Secondary | ICD-10-CM

## 2011-08-17 DIAGNOSIS — Z954 Presence of other heart-valve replacement: Secondary | ICD-10-CM

## 2011-08-17 DIAGNOSIS — I2510 Atherosclerotic heart disease of native coronary artery without angina pectoris: Secondary | ICD-10-CM

## 2011-08-17 NOTE — Assessment & Plan Note (Signed)
He is s/p AVR. Very soft outflow murmur noted. Will check a follow up echo at his return appointment.

## 2011-08-17 NOTE — Assessment & Plan Note (Signed)
He is 3 weeks post op. He is making steady progress. I have encouraged him to go to cardiac rehab. No change in his medicines. Will check baseline labs today. Will see him back in about 3 weeks with fasting labs.

## 2011-08-17 NOTE — Progress Notes (Signed)
Stephen Mendoza Date of Birth: 10/30/1940 Medical Record #[id number]  History of Present Illness: Stephen Mendoza is seen back today for a post hospital visit. He is seen for Dr. Elease Hashimoto. He has CAD and aortic stenosis and has recently undergone CABG x 2 with AVR with LIMA to LAD, SVG to PDA, septal myomectomy, and a #37mm [employer] tissue valve. His other problems include HTN and BPH.  He comes in today. He is here with his wife, [name]. He is 3 weeks post op. Doing well and making steady progress. Appetite is ok. Bowels are working ok. Using only Ultram for discomfort. Has not started cardiac rehab but has been called by the rehab personnel at [facility]. He notes some shoulder pain that hurt with movement and some belching that was uncomfortable but this has all resolved. He is not short of breath. Blood pressures at home are very good. It is a little higher here today. He was seen at [facility] earlier today. He is walking three times a day and tolerating very well. He is tolerating his medicines. He is anxious to get back to his baseline.   Current Outpatient Prescriptions on File Prior to Visit  Medication Sig Dispense Refill  . aspirin 325 MG tablet Take 325 mg by mouth daily.      . Coenzyme Q10 (CO Q 10) 100 MG CAPS Take 100 mg by mouth 2 (two) times daily.       Marland Kitchen HAWTHORNE PO Take 300 mg by mouth 2 (two) times daily.      Marland Kitchen losartan-hydrochlorothiazide (HYZAAR) 100-25 MG per tablet Take 1 tablet by mouth daily.        . metoprolol tartrate (LOPRESSOR) 25 MG tablet Take 1 tablet (25 mg total) by mouth 2 (two) times daily.  60 tablet  1  . Multiple Vitamin (MULTIVITAMIN) capsule Take 1 capsule by mouth daily.        . Omega-3 Fatty Acids (FISH OIL PO) Take 1,600 mg by mouth 2 (two) times daily.        Marland Kitchen pyridOXINE (B-6) 50 MG tablet Take 50 mg by mouth daily.      . simvastatin (ZOCOR) 10 MG tablet Take 1 tablet (10 mg total) by mouth at bedtime.  30 tablet  1    No Known  Allergies  Past Medical History  Diagnosis Date  . Hypertension     Essential  . Aortic stenosis     severe  . Palpitations   . Aortic regurgitation   . Mitral regurgitation     Mild moderate  . Left atrial enlargement   . Polio osteopathy of lower leg   . Benign prostatic hypertrophy (BPH) with nocturia   . PONV (postoperative nausea and vomiting)   . Shortness of breath   . S/P aortic valve replacement with bioprosthetic valve 07/22/2011    21mm [facility] Ease pericardial tissue valve  . 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 Coronary artery disease   . GERD (gastroesophageal reflux disease)     Past Surgical History  Procedure Date  . Cataract extraction   . Cardiac catheterization   . Coronary artery bypass graft 07/22/2011  . Aortic valve replacement 07/22/2011    History  Smoking status  . Former Smoker  . Types: Cigarettes  . Quit date: 10/24/1970  Smokeless tobacco  . Never Used    History  Alcohol Use No  Family History  Problem Relation Age of Onset  . Heart attack Father     Review of Systems: The review of systems is per the HPI.  All other systems were reviewed and are negative.  Physical Exam: BP 138/70  Pulse 94  Ht 5\' 8"  (1.727 m)  Wt 170 lb 6.4 oz (77.293 kg)  BMI 25.91 kg/m2 Patient is alert and in no acute distress. Skin is warm and dry. Color is normal.  HEENT is unremarkable. Normocephalic/atraumatic. PERRL. Sclera are nonicteric. Neck is supple. No masses. No JVD. Lungs are clear but diminished in the bases. Cardiac exam shows a regular rate and rhythm.He has a very soft outflow murmur. Sternum looks good.  Abdomen is soft. Extremities are without edema. Gait and ROM are intact. No gross neurologic deficits noted.   LABORATORY DATA: BMET and CBC are pending.  EKG today shows sinus rhythm, diffuse T wave changes. Tracing was reviewed with Dr. Patty Sermons today (DOD).   Assessment / Plan:

## 2011-08-17 NOTE — Progress Notes (Signed)
HPI: Patient returns for routine postoperative follow-up having undergone AVR, Septal Myomectomy, and CABG x2 on 07/22/2011. The patient's early postoperative recovery while in the hospital was notable for hypertension, but was otherwise unremarkable. Since hospital discharge the patient reports he is doing well.  He denies chest pain and shortness of breath.  He does states he has had several episodes of horrible sharp pain along the area of his left breast and into his shoulder.  He also states it is painful to belch.  The patient states he is ambulating, but does not feel like he is where he should be.  He states he is able to walk 20 min at a time three times daily.  He does not have an appetite, but is eating what he can.  He had multiple questions regarding pictures from his surgery and these were all addressed and answered.   Current Outpatient Prescriptions  Medication Sig Dispense Refill  . aspirin 325 MG tablet Take 325 mg by mouth daily.      . Coenzyme Q10 (CO Q 10) 100 MG CAPS Take 100 mg by mouth 2 (two) times daily.       Marland Kitchen HAWTHORNE PO Take 300 mg by mouth 2 (two) times daily.      Marland Kitchen losartan-hydrochlorothiazide (HYZAAR) 100-25 MG per tablet Take 1 tablet by mouth daily.        . metoprolol tartrate (LOPRESSOR) 25 MG tablet Take 1 tablet (25 mg total) by mouth 2 (two) times daily.  60 tablet  1  . Multiple Vitamin (MULTIVITAMIN) capsule Take 1 capsule by mouth daily.        . Omega-3 Fatty Acids (FISH OIL PO) Take 1,600 mg by mouth 2 (two) times daily.        Marland Kitchen pyridOXINE (B-6) 50 MG tablet Take 50 mg by mouth daily.      . simvastatin (ZOCOR) 10 MG tablet Take 1 tablet (10 mg total) by mouth at bedtime.  30 tablet  1    Physical Exam:  BP 155/84  Pulse 90  Resp 18  Ht 5\' 8"  (1.727 m)  Wt 170 lb (77.111 kg)  BMI 25.85 kg/m2  SpO2 97%  Gen: no apparent distress, looks great Heart: RRR, sternum stable Lungs: decreased left base Abd: soft non-tender, non-distended Skin:  incisions healing well, no signs of infection appreciated Ext: no LE edema Neuro: grossly intact  Diagnostic Tests:  CXR: improvement of left pleural effusion. Post surgical changes present, sternal wires in tact  Impression:  Stephen Mendoza is a 71 yo male who is S/P AVR, Septal Myomectomy, and CABG x2 from 07/22/2011.  Overall I feel the patient is doing very well.  He is hypertensive today with a heart rate of 90.  I feel the patient would benefit from an increase in his Lopressor to 50mg  BID, however the patient declined and states that his blood pressure has been running normal to low at home and he wishes to have this addressed by his cardiologist.  In regards to his pain along his left chest I feel this is mostly likely associated with post surgical changes due to LIMA harvest and with time these symptoms should resolve.  In regards to patient not feeling his up to where he should be activity wise, I explained to the patient that recovery is a slow process.  He underwent an invasive procedure and it is not something that you recover from in a few weeks times.  I encouraged the patient to  continue to increase his activity level as he can tolerate and he will progress and reach fully recovered state approximately 2 months from date of surgery. Finally, he was told he could begin driving short distances.  Plan:  I will have patient follow up with Dr. Cornelius Moras in 4 weeks time with repeat chest xray.  His pleural effusion has improved since hospital discharge, however patient was instructed that should he develop progressive shortness of breath or orthopnea he should contact our office, because that could be an indication of increasing pleural fluid.

## 2011-08-17 NOTE — Patient Instructions (Addendum)
We will see you in 3 weeks with fasting labs, echo and an office visit with Dr. Elease Hashimoto.  Stay on your current medicines  Ok to start cardiac rehab  Call the [facility] office at [phone] if you have any questions, problems or concerns.

## 2011-08-17 NOTE — Assessment & Plan Note (Signed)
Blood pressure is doing well at home. Recheck by me is ok. For now, no change in his current therapy.

## 2011-08-18 LAB — BASIC METABOLIC PANEL
BUN: 16 mg/dL (ref 6–23)
CO2: 32 mEq/L (ref 19–32)
Calcium: 9.4 mg/dL (ref 8.4–10.5)
Chloride: 98 mEq/L (ref 96–112)
Creatinine, Ser: 0.7 mg/dL (ref 0.4–1.5)
GFR: 113.31 mL/min (ref >60.00)
Glucose, Bld: 159 mg/dL — ABNORMAL HIGH (ref 70–99)
Potassium: 3.8 mEq/L (ref 3.5–5.1)
Sodium: 139 mEq/L (ref 135–145)

## 2011-08-18 LAB — CBC WITH DIFFERENTIAL/PLATELET
Basophils Absolute: 0.1 10*3/uL (ref 0.0–0.1)
Basophils Relative: 0.9 % (ref 0.0–3.0)
Eosinophils Absolute: 1 10*3/uL — ABNORMAL HIGH (ref 0.0–0.7)
Eosinophils Relative: 11.7 % — ABNORMAL HIGH (ref 0.0–5.0)
HCT: 32.9 % — ABNORMAL LOW (ref 39.0–52.0)
Hemoglobin: 10.8 g/dL — ABNORMAL LOW (ref 13.0–17.0)
Lymphocytes Relative: 19 % (ref 12.0–46.0)
Lymphs Abs: 1.6 10*3/uL (ref 0.7–4.0)
MCHC: 32.8 g/dL (ref 30.0–36.0)
MCV: 90.7 fl (ref 78.0–100.0)
Monocytes Absolute: 0.5 10*3/uL (ref 0.1–1.0)
Monocytes Relative: 6 % (ref 3.0–12.0)
Neutro Abs: 5.2 10*3/uL (ref 1.4–7.7)
Neutrophils Relative %: 62.4 % (ref 43.0–77.0)
Platelets: 314 10*3/uL (ref 150.0–400.0)
RBC: 3.63 Mil/uL — ABNORMAL LOW (ref 4.22–5.81)
RDW: 14 % (ref 11.5–14.6)
WBC: 8.3 10*3/uL (ref 4.5–10.5)

## 2011-08-20 ENCOUNTER — Telehealth: Payer: Self-pay | Admitting: Nurse Practitioner

## 2011-08-20 NOTE — Telephone Encounter (Signed)
Patient request return call to patient regarding several episodes of slurred speech.  Pt did not seek  Emergency care for this matter.  Please return call to discuss [phone]

## 2011-08-20 NOTE — Telephone Encounter (Signed)
Would refer to the ER for evaluation of possible TIAs/CVA.

## 2011-08-20 NOTE — Telephone Encounter (Signed)
Message copied by Awilda Bill on Thu Aug 20, 2011 11:17 AM ------      Message from: Rosalio Macadamia      Created: Tue Aug 18, 2011  4:51 PM       Ok to report. Labs are satisfactory. He is post op. Stay on current medicines.

## 2011-08-20 NOTE — Telephone Encounter (Signed)
Called patient regarding his blood work.  He reports that he has called our office today to reports that he has had about 5 episodes of slurred speech that were brief.  Pt states he is talking slurred as he is speaking to me, this was not evident to me.  He wanted to know what he should do for this.  Wants Lori's opinion.  Will forward to [facility] for advice.  Vista Mink, CMA

## 2011-08-20 NOTE — Telephone Encounter (Signed)
Per Lawson Fiscal, patient should go directly to ED for a check and possibly a CT of the head.  Called patient to notify and he stated that he understood, but kept expressing this was the only symptom he was having.  Again, I explained to the patient that Lawson Fiscal is convinced he should go directly to the emergency room.  Pt stated that he would, but did not seem convinced that this was necessary.  Vista Mink, CMA

## 2011-08-24 NOTE — Addendum Note (Signed)
Addended by: Kyla Balzarine A on: 08/24/2011 01:56 PM   Modules accepted: Orders

## 2011-09-15 ENCOUNTER — Encounter: Payer: Self-pay | Admitting: Nurse Practitioner

## 2011-09-15 ENCOUNTER — Ambulatory Visit (INDEPENDENT_AMBULATORY_CARE_PROVIDER_SITE_OTHER): Payer: Medicare Other | Admitting: Nurse Practitioner

## 2011-09-15 ENCOUNTER — Ambulatory Visit (HOSPITAL_COMMUNITY): Payer: Medicare Other | Attending: Cardiology | Admitting: Radiology

## 2011-09-15 ENCOUNTER — Other Ambulatory Visit (INDEPENDENT_AMBULATORY_CARE_PROVIDER_SITE_OTHER): Payer: Medicare Other

## 2011-09-15 VITALS — BP 172/98 | HR 82 | Ht 68.0 in | Wt 164.0 lb

## 2011-09-15 DIAGNOSIS — Z951 Presence of aortocoronary bypass graft: Secondary | ICD-10-CM

## 2011-09-15 DIAGNOSIS — I10 Essential (primary) hypertension: Secondary | ICD-10-CM | POA: Insufficient documentation

## 2011-09-15 DIAGNOSIS — I059 Rheumatic mitral valve disease, unspecified: Secondary | ICD-10-CM | POA: Insufficient documentation

## 2011-09-15 DIAGNOSIS — I25810 Atherosclerosis of coronary artery bypass graft(s) without angina pectoris: Secondary | ICD-10-CM

## 2011-09-15 DIAGNOSIS — I2510 Atherosclerotic heart disease of native coronary artery without angina pectoris: Secondary | ICD-10-CM | POA: Insufficient documentation

## 2011-09-15 DIAGNOSIS — I079 Rheumatic tricuspid valve disease, unspecified: Secondary | ICD-10-CM | POA: Insufficient documentation

## 2011-09-15 DIAGNOSIS — R002 Palpitations: Secondary | ICD-10-CM | POA: Insufficient documentation

## 2011-09-15 DIAGNOSIS — I359 Nonrheumatic aortic valve disorder, unspecified: Secondary | ICD-10-CM

## 2011-09-15 DIAGNOSIS — R0989 Other specified symptoms and signs involving the circulatory and respiratory systems: Secondary | ICD-10-CM

## 2011-09-15 DIAGNOSIS — Z87891 Personal history of nicotine dependence: Secondary | ICD-10-CM | POA: Insufficient documentation

## 2011-09-15 DIAGNOSIS — E785 Hyperlipidemia, unspecified: Secondary | ICD-10-CM

## 2011-09-15 LAB — HEPATIC FUNCTION PANEL
ALT: 15 U/L (ref 0–53)
AST: 19 U/L (ref 0–37)
Albumin: 4 g/dL (ref 3.5–5.2)
Alkaline Phosphatase: 52 U/L (ref 39–117)
Bilirubin, Direct: 0.1 mg/dL (ref 0.0–0.3)
Total Bilirubin: 0.6 mg/dL (ref 0.3–1.2)
Total Protein: 7.2 g/dL (ref 6.0–8.3)

## 2011-09-15 LAB — BASIC METABOLIC PANEL
BUN: 9 mg/dL (ref 6–23)
CO2: 30 mEq/L (ref 19–32)
Calcium: 9.5 mg/dL (ref 8.4–10.5)
Chloride: 99 mEq/L (ref 96–112)
Creatinine, Ser: 0.7 mg/dL (ref 0.4–1.5)
GFR: 111.52 mL/min (ref >60.00)
Glucose, Bld: 104 mg/dL — ABNORMAL HIGH (ref 70–99)
Potassium: 3.7 mEq/L (ref 3.5–5.1)
Sodium: 139 mEq/L (ref 135–145)

## 2011-09-15 LAB — CBC WITH DIFFERENTIAL/PLATELET
Basophils Absolute: 0.1 10*3/uL (ref 0.0–0.1)
Basophils Relative: 1 % (ref 0.0–3.0)
Eosinophils Absolute: 0.4 10*3/uL (ref 0.0–0.7)
Eosinophils Relative: 6.6 % — ABNORMAL HIGH (ref 0.0–5.0)
HCT: 39.7 % (ref 39.0–52.0)
Hemoglobin: 12.8 g/dL — ABNORMAL LOW (ref 13.0–17.0)
Lymphocytes Relative: 27.3 % (ref 12.0–46.0)
Lymphs Abs: 1.8 10*3/uL (ref 0.7–4.0)
MCHC: 32.2 g/dL (ref 30.0–36.0)
MCV: 88.9 fl (ref 78.0–100.0)
Monocytes Absolute: 0.4 10*3/uL (ref 0.1–1.0)
Monocytes Relative: 6.7 % (ref 3.0–12.0)
Neutro Abs: 3.8 10*3/uL (ref 1.4–7.7)
Neutrophils Relative %: 58.4 % (ref 43.0–77.0)
Platelets: 244 10*3/uL (ref 150.0–400.0)
RBC: 4.47 Mil/uL (ref 4.22–5.81)
RDW: 15 % — ABNORMAL HIGH (ref 11.5–14.6)
WBC: 6.5 10*3/uL (ref 4.5–10.5)

## 2011-09-15 LAB — LIPID PANEL
Cholesterol: 218 mg/dL — ABNORMAL HIGH (ref 0–200)
HDL: 64.1 mg/dL (ref >39.00)
Total CHOL/HDL Ratio: 3
Triglycerides: 67 mg/dL (ref 0.0–149.0)
VLDL: 13.4 mg/dL (ref 0.0–40.0)

## 2011-09-15 LAB — LDL CHOLESTEROL, DIRECT: Direct LDL: 140.3 mg/dL

## 2011-09-15 NOTE — Patient Instructions (Addendum)
Monitor your blood pressure at home. Goal is for your BP to be less than 140/85. If it is consistently elevated, call us.  Your ultrasound looks good.   You may stop the Lopressor  We will check labs today  Your blood type was O positive  We will see you in 6 weeks.  Call the [facility] office at [phone] if you have any questions, problems or concerns.

## 2011-09-15 NOTE — Progress Notes (Signed)
Echocardiogram performed.  

## 2011-09-15 NOTE — Progress Notes (Signed)
[name] Date of Birth: 07/11/1940 Medical Record #[id number]  History of Present Illness: Stephen Mendoza is seen back today for a one month check. He is seen for Dr. Elease Hashimoto. He has had CAD/AS and had undergone CABG x 2 & AVR with LIMA to LAD, SVG to PDA, septal myomectomy and a #68mm [employer] tissue valve. Other problems included HTN and BPH.  He comes in today. He is here with his wife. She provides most of this history today. He is about 2 months out from his surgery. Was making progress but has really regressed since his last visit here. Shortly after I saw him last month, his wife called to report episodes of slurred speech. He went to the ER in [place] and had extensive testing and told that he was having mini strokes. Plavix was prescribed. The slurred speech continues, exacerbated by eating and went back to the ER with the same diagnosis given. He then reported to the ER doctor that prior to his heart surgery, he had had a couple of spells of difficulty keeping his left eye open. 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 His labs for myasthenia are pending.   He continues to have slurred speech. He has had a few spells where it was hard for him to hold up his head. The lights hurt his eyes. He is not short of breath. He is not able to walk very far and has become quite sedentary. He is needless to say, quite frustrated and depressed as well. They have been doing research on the internet. He has stopped his statin therapy due to possible interaction. He wants off of his beta blocker as well. They have been monitoring his blood pressure at home very closely and he has great readings at home.   Current Outpatient Prescriptions on File Prior to Visit  Medication Sig Dispense Refill  . aspirin 325 MG tablet Take 325 mg by mouth daily.      . Coenzyme Q10 (CO Q 10) 100 MG CAPS Take 100 mg by mouth 2 (two) times daily.       Marland Kitchen  HAWTHORNE PO Take 300 mg by mouth 2 (two) times daily.      Marland Kitchen losartan-hydrochlorothiazide (HYZAAR) 100-25 MG per tablet Take 1 tablet by mouth daily.        . Multiple Vitamin (MULTIVITAMIN) capsule Take 1 capsule by mouth daily.        . Omega-3 Fatty Acids (FISH OIL PO) Take 1,600 mg by mouth 2 (two) times daily.        Marland Kitchen pyridOXINE (B-6) 50 MG tablet Take 50 mg by mouth daily.      . simvastatin (ZOCOR) 10 MG tablet Take 1 tablet (10 mg total) by mouth at bedtime.  30 tablet  1    No Known Allergies  Past Medical History  Diagnosis Date  . Hypertension     Essential  . Aortic stenosis     severe  . Palpitations   . Aortic regurgitation   . Mitral regurgitation     Mild moderate  . Left atrial enlargement   . Polio osteopathy of lower leg   . Benign prostatic hypertrophy (BPH) with nocturia   . PONV (postoperative nausea and vomiting)   . Shortness of breath   . S/P aortic valve replacement with bioprosthetic valve 07/22/2011    21mm [facility] Ease pericardial tissue valve  . 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 Coronary artery disease   . GERD (gastroesophageal reflux disease)     Past Surgical History  Procedure Date  . Cataract extraction   . Cardiac catheterization   . Coronary artery bypass graft 07/22/2011  . Aortic valve replacement 07/22/2011    History  Smoking status  . Former Smoker  . Types: Cigarettes  . Quit date: 10/24/1970  Smokeless tobacco  . Never Used    History  Alcohol Use No    Family History  Problem Relation Age of Onset  . Heart attack Father     Review of Systems: The review of systems is per the HPI.  All other systems were reviewed and are negative.  Physical Exam: BP 172/98  Pulse 82  Ht 5\' 8"  (1.727 m)  Wt 164 lb (74.39 kg)  BMI 24.94 kg/m2 Patient is very pleasant and in no acute distress. Skin is warm and dry. Color is normal.  HEENT is unremarkable but his left eye is  drooping. Normocephalic/atraumatic. PERRL. Sclera are nonicteric. Neck is supple. No masses. No JVD. Lungs are clear. Cardiac exam shows a regular rate and rhythm. Abdomen is soft. Extremities are without edema. Gait and ROM are intact. No gross neurologic deficits noted.   LABORATORY DATA: Pending for today.  Lab Results  Component Value Date   WBC 8.3 08/17/2011   HGB 10.8* 08/17/2011   HCT 32.9* 08/17/2011   PLT 314.0 08/17/2011   GLUCOSE 159* 08/17/2011   ALT 27 07/20/2011   AST 26 07/20/2011   NA 139 08/17/2011   K 3.8 08/17/2011   CL 98 08/17/2011   CREATININE 0.7 08/17/2011   BUN 16 08/17/2011   CO2 32 08/17/2011   INR 1.53* 07/22/2011   HGBA1C 5.7* 07/20/2011     Echo Study Conclusions  - Left ventricle: The cavity size was normal. Wall thickness was increased in a pattern of mild LVH. Systolic function was normal. The estimated ejection fraction was in the range of 55% to 65%. Wall motion was normal; there were no regional wall motion abnormalities. Doppler parameters are consistent with abnormal left ventricular relaxation (grade 1 diastolic dysfunction). - Aortic valve: A was present and functioning normally. Mean gradient: 13mm Hg (S). Peak gradient: 27mm Hg (S). - Mitral valve: Mild regurgitation. - Left atrium: The atrium was mildly dilated. - Right atrium: The atrium was mildly dilated. - Pulmonary arteries: Systolic pressure was mildly increased. PA peak pressure: 34mm Hg (S).   Assessment / Plan: 1. S/P CABG/AVR. His echo is reviewed with him. From our standpoint he looks ok. Unfortunately the presumed myasthenia is a set back for him. Currently with no pulmonary complaints and he has evaluation underway.  2. HTN - He wants off of his beta blocker. Has already cut it back and has stopped his statin. I have stopped the Lopressor and asked him to continue with monitoring at home and to call us if his readings are consistently above 140/85.   We will be checking  labs today. His echo results were given to him today. We will see him back in about 6 to 8 weeks to see where we stand with things. His neurology evaluation is underway. Patient is agreeable to this plan and will call if any problems develop in the interim.

## 2011-09-16 ENCOUNTER — Telehealth: Payer: Self-pay | Admitting: *Deleted

## 2011-09-16 NOTE — Telephone Encounter (Signed)
Pt aware of lab results and recommendations. Vista Mink, CMA

## 2011-09-16 NOTE — Telephone Encounter (Signed)
Message copied by Awilda Bill on Wed Sep 16, 2011  4:12 PM ------      Message from: Rosalio Macadamia      Created: Tue Sep 15, 2011  5:12 PM       Ok to report. Labs are satisfactory. His lipids are elevated but will hold on restarting statin until his neurology work up is complete and more established.

## 2011-09-21 ENCOUNTER — Ambulatory Visit
Admission: RE | Admit: 2011-09-21 | Discharge: 2011-09-21 | Disposition: A | Discharge status: Home / Self Care | Payer: Medicare Other | Source: Ambulatory Visit | Attending: Thoracic Surgery (Cardiothoracic Vascular Surgery) | Admitting: Thoracic Surgery (Cardiothoracic Vascular Surgery)

## 2011-09-21 ENCOUNTER — Encounter: Payer: Self-pay | Admitting: Thoracic Surgery (Cardiothoracic Vascular Surgery)

## 2011-09-21 ENCOUNTER — Ambulatory Visit (INDEPENDENT_AMBULATORY_CARE_PROVIDER_SITE_OTHER): Payer: Self-pay | Admitting: Thoracic Surgery (Cardiothoracic Vascular Surgery)

## 2011-09-21 VITALS — BP 156/99 | HR 89 | Resp 18 | Ht 68.0 in | Wt 160.0 lb

## 2011-09-21 DIAGNOSIS — Z953 Presence of xenogenic heart valve: Secondary | ICD-10-CM

## 2011-09-21 DIAGNOSIS — Z954 Presence of other heart-valve replacement: Secondary | ICD-10-CM

## 2011-09-21 DIAGNOSIS — G7000 Myasthenia gravis without (acute) exacerbation: Secondary | ICD-10-CM

## 2011-09-21 DIAGNOSIS — I2510 Atherosclerotic heart disease of native coronary artery without angina pectoris: Secondary | ICD-10-CM

## 2011-09-21 DIAGNOSIS — I359 Nonrheumatic aortic valve disorder, unspecified: Secondary | ICD-10-CM

## 2011-09-21 DIAGNOSIS — Z951 Presence of aortocoronary bypass graft: Secondary | ICD-10-CM

## 2011-09-21 HISTORY — DX: Myasthenia gravis without (acute) exacerbation: G70.00

## 2011-09-21 NOTE — Progress Notes (Signed)
[address]            [name] [address]          [phone]     CARDIOTHORACIC SURGERY OFFICE NOTE  Referring Provider is [name], MD PCP is Judge Stall, MD   HPI:  Patient returns for routine followup status post aortic valve replacement, septal myomectomy and coronary artery bypass grafting on 07/22/2011.  His initial postoperative recovery was entirely unremarkable. He was seen most recently here in the office on 08/17/2011 at which time progressing fairly well.  However, shortly after that he develop fairly abrupt onset of intermittent slurred speech with severe droopy eyelids, difficulty swallowing, and difficulty clearing sputum. He was seen in the emergency department at [facility] in -- brought on 2 occasions and initially diagnosed with TIAs. He was started on Plavix. However, subsequently on return he was found to have myasthenia gravis, which was confirmed on antibiotic testing. In retrospect, the patient notes that even prior to his heart surgery he had had some brief episodes of to be eyelids which he attributed to blood pressure medications, and these symptoms have apparently resolved shortly before his heart surgery and apparently improved after his heart surgery for the first month.  Subsequent to the diagnosis of myasthenia gravis he was started on prednisone. However, he states that he did not tolerate this at all and it actually seemed to make his symptoms worse. He is now off prednisone and schedule to followup with his neurologist later this week. He states that his symptoms are a little bit improved but he still has difficulty swallowing and has trouble eating anything other than soft foods. His eyelids remain intermittently droopy. His speech has improved. From the standpoint of his previous surgery, he has very mild residual soreness and numbness associated with his median sternotomy. He otherwise feels fine. He has no  shortness of breath.   Current Outpatient Prescriptions  Medication Sig Dispense Refill  . aspirin 325 MG tablet Take 325 mg by mouth daily.      . clopidogrel (PLAVIX) 75 MG tablet Take 75 mg by mouth daily.       . Coenzyme Q10 (CO Q 10) 100 MG CAPS Take 100 mg by mouth 2 (two) times daily.       Marland Kitchen losartan-hydrochlorothiazide (HYZAAR) 100-25 MG per tablet Take 1 tablet by mouth daily.        . Multiple Vitamin (MULTIVITAMIN) capsule Take 1 capsule by mouth daily.        . Omega-3 Fatty Acids (FISH OIL PO) Take 1,600 mg by mouth 2 (two) times daily.        Marland Kitchen pyridOXINE (B-6) 50 MG tablet Take 50 mg by mouth daily.          Physical Exam:   BP 156/99  Pulse 89  Resp 18  Ht 5\' 8"  (1.727 m)  Wt 160 lb (72.576 kg)  BMI 24.33 kg/m2  SpO2 97%  General:  Well-appearing  Chest:   Clear to auscultation with symmetrical breath sounds  CV:   Regular rate and rhythm  Incisions:  Completely healed  Abdomen:  Soft and nontender  Extremities:  Warm and well-perfused  Diagnostic Tests:  ------------------------------------------------------------ Transthoracic Echocardiography  Patient:    Stephen Mendoza, Stephen Mendoza MR #:       [id number] Study Date: 09/15/2011 Gender:     M Age:  71 Height:     172.7cm Weight:     77.1kg BSA:        1.76m^2 Pt. Status: Room:    REFERRING    Nahser, Philip  ATTENDING    Jens Som, Arlys John  PERFORMING   Redge Gainer, [facility]  ORDERING     Tyrone Sage, Lori  REFERRING    Norma Fredrickson  SONOGRAPHER  Junious Dresser, RDCS cc:  ------------------------------------------------------------ LV EF: 55% -   65%  ------------------------------------------------------------ Indications:      424.1 Aortic valve disorders.  414.01 Coronary atherosclerosis - native artery.  ------------------------------------------------------------ History:   PMH:  Acquired from the patient and from the patient's chart.  Palpitations.  Coronary artery disease. Aortic valve  disease, post prosthetic replacement.  Risk factors:  Former tobacco use. Hypertension.  ------------------------------------------------------------ Study Conclusions  - Left ventricle: The cavity size was normal. Wall thickness   was increased in a pattern of mild LVH. Systolic function   was normal. The estimated ejection fraction was in the   range of 55% to 65%. Wall motion was normal; there were no   regional wall motion abnormalities. Doppler parameters are   consistent with abnormal left ventricular relaxation   (grade 1 diastolic dysfunction). - Aortic valve: A was present and   functioning normally. Mean gradient: 13mm Hg (S). Peak   gradient: 27mm Hg (S). - Mitral valve: Mild regurgitation. - Left atrium: The atrium was mildly dilated. - Right atrium: The atrium was mildly dilated. - Pulmonary arteries: Systolic pressure was mildly   increased. PA peak pressure: 34mm Hg (S).  ------------------------------------------------------------ Labs, prior tests, procedures, and surgery: Echocardiography (May 2013).    The aortic valve showed severe stenosis.  EF was 65% and PA pressure was 34 (systolic). Aortic valve: peak gradient of Hg and mean gradient of 54mm Hg.  Coronary artery bypass grafting (July 2013).    Aortic valve replacement with a 21mm [employer] Ease pericardial valve. Septal myotomy. Transthoracic echocardiography.  M-mode, complete 2D, spectral Doppler, and color Doppler.  Height:  Height: 172.7cm. Height: 68in.  Weight:  Weight: 77.1kg. Weight: 169.6lb.  Body mass index:  BMI: 25.8kg/m^2.  Body surface area:    BSA: 1.19m^2.  Blood pressure:     138/72.  Patient status:  Outpatient.  Location:   [facility]  ------------------------------------------------------------  ------------------------------------------------------------ Left ventricle:  The cavity size was normal. Wall thickness was increased in a pattern of  mild LVH. Systolic function was normal. The estimated ejection fraction was in the range of 55% to 65%. Wall motion was normal; there were no regional wall motion abnormalities. Doppler parameters are consistent with abnormal left ventricular relaxation (grade 1 diastolic dysfunction).  ------------------------------------------------------------ Aortic valve:  A was present and functioning normally.  Doppler:   No significant regurgitation.    VTI ratio of LVOT to aortic valve: 0.33. Peak velocity ratio of LVOT to aortic valve: 0.37.    Mean gradient: 13mm Hg (S). Peak gradient: 27mm Hg (S).  ------------------------------------------------------------ Aorta:  Aortic root: The aortic root was normal in size.  ------------------------------------------------------------ Mitral valve:   Structurally normal valve.   Leaflet separation was normal.  Doppler:  Transvalvular velocity was within the normal range. There was no evidence for stenosis.  Mild regurgitation.    Peak gradient: 2mm Hg (D).  ------------------------------------------------------------ Left atrium:  The atrium was mildly dilated.  ------------------------------------------------------------ Right ventricle:  The cavity size was normal. Systolic function was normal.  ------------------------------------------------------------ Pulmonic valve:    Structurally  normal valve.   Cusp separation was normal.  Doppler:  Transvalvular velocity was within the normal range.  No regurgitation.  ------------------------------------------------------------ Tricuspid valve:   Structurally normal valve.   Leaflet separation was normal.  Doppler:  Transvalvular velocity was within the normal range.  Mild regurgitation.  ------------------------------------------------------------ Pulmonary artery:   Systolic pressure was mildly increased.   ------------------------------------------------------------ Right  atrium:  The atrium was mildly dilated.  ------------------------------------------------------------ Pericardium:  There was no pericardial effusion.  ------------------------------------------------------------ Systemic veins: Inferior vena cava: The vessel was normal in size; the respirophasic diameter changes were in the normal range (= 50%); findings are consistent with normal central venous pressure.  ------------------------------------------------------------  2D measurements        Normal  Doppler               Normal Left ventricle                 measurements LVID ED,   32.1 mm     43-52   Main pulmonary chord,                         artery PLAX                           Pressure, S    34 mm  =30 LVID ES,   20.6 mm     23-38                     Hg chord,                         Left ventricle PLAX                           Ea, lat      11.4 cm/ ------- FS,          36 %      >29     ann, tiss         s chord,                         DP PLAX                           E/Ea, lat    6.37     ------- LVPW, ED  11.63 mm     ------  ann, tiss IVS/LVPW   0.99        <1.3    DP ratio, ED                      Ea, med       5.7 cm/ ------- Ventricular septum             ann, tiss         s IVS, ED   11.57 mm     ------  DP Aorta                          E/Ea, med   12.74     ------- Root         28 mm     ------  ann, tiss diam, ED  DP AAo AP       32 mm     ------  LVOT diam, S                        Peak vel, S  96.3 cm/ ------- Left atrium                                      s AP dim       44 mm     ------  VTI, S         16 cm  ------- AP dim      2.3 cm/m^2 <2.2    Aortic valve index                          Peak vel, S   259 cm/ -------                                                  s                                Mean vel, S   163 cm/ -------                                                  s                                VTI, S       48.5  cm  -------                                Mean           13 mm  -------                                gradient, S       Hg                                Peak           27 mm  -------                                gradient, S       Hg                                VTI ratio    0.33     -------                                LVOT/AV  Peak vel     0.37     -------                                ratio,                                LVOT/AV                                Mitral valve                                Peak E vel   72.6 cm/ -------                                                  s                                Peak A vel   96.3 cm/ -------                                                  s                                Deceleratio   222 ms  150-230                                n time                                Peak            2 mm  -------                                gradient, D       Hg                                Peak E/A      0.8     -------                                ratio                                Tricuspid valve                                Regurg peak   271 cm/ -------  vel               s                                Peak RV-RA     29 mm  -------                                gradient, S       Hg                                Systemic veins                                Estimated       5 mm  -------                                CVP               Hg                                Right ventricle                                Pressure, S    34 mm  <30                                                  Hg                                Sa vel, lat  8.55 cm/ -------                                ann, tiss         s                                DP   ------------------------------------------------------------ Prepared and Electronically Authenticated by  Olga Millers 2013-09-03T11:11:[id number]    CHEST - 2 VIEW  Comparison: 08/24/2011  Findings: Median sternotomy for aortic valve repair. Lateral view  degraded by patient arm position.  Moderate lower thoracic spondylosis.  Normal heart size with a tortuous descending thoracic aorta.  Minimal right-sided pleural thickening blunts the costophrenic  angle on the frontal. No pneumothorax. No congestive failure.  Minimal atelectasis or scarring at the left lung base.  IMPRESSION:  Postoperative changes, without acute process.  Original Report Authenticated By: Consuello Bossier, M.D.   Impression:  The patient is doing well from a cardiovascular standpoint 2 months following aortic valve replacement, septal myomectomy, and coronary artery bypass grafting x2. Unfortunately he has developed a fairly severe flare up of symptoms related to myasthenia gravis. He is currently under the  care of Dr. Adella Hare in [place] who he has an appointment to see in followup later this week.  Plan:  I've offered to schedule the patient for a CT scan of the chest to rule out the presence of thymoma. However, unless the thymoma was in an ectopic location or remarkably small we would have seen it at the time of his surgery 2 months ago.  He does not wish to have a CT scan at this time.  At this point we have not made any other recommendations for changes in his therapy, although I suppose it is probably no reason for him to be taking Plavix any longer. We will plan to see him back in 3 months to make sure that he continues to recover.     Salvatore Decent. Cornelius Moras, MD 09/21/2011 12:47 PM

## 2011-10-28 ENCOUNTER — Ambulatory Visit: Payer: Self-pay | Admitting: Cardiovascular Disease

## 2011-11-06 ENCOUNTER — Ambulatory Visit (INDEPENDENT_AMBULATORY_CARE_PROVIDER_SITE_OTHER): Payer: Medicare Other | Admitting: Cardiovascular Disease

## 2011-11-06 ENCOUNTER — Encounter: Payer: Self-pay | Admitting: Cardiovascular Disease

## 2011-11-06 VITALS — BP 175/99 | HR 70 | Resp 18 | Wt 169.2 lb

## 2011-11-06 DIAGNOSIS — E785 Hyperlipidemia, unspecified: Secondary | ICD-10-CM

## 2011-11-06 DIAGNOSIS — I350 Nonrheumatic aortic (valve) stenosis: Secondary | ICD-10-CM

## 2011-11-06 DIAGNOSIS — I359 Nonrheumatic aortic valve disorder, unspecified: Secondary | ICD-10-CM

## 2011-11-06 MED ORDER — SIMVASTATIN 40 MG PO TABS: 40.0000 mg | ORAL_TABLET | Freq: Every day | ORAL | Status: DC

## 2011-11-06 MED ORDER — DOXAZOSIN MESYLATE 4 MG PO TABS: 4.0000 mg | ORAL_TABLET | Freq: Every day | ORAL | Status: DC

## 2011-11-06 NOTE — Patient Instructions (Addendum)
Your physician recommends that you schedule a follow-up appointment in: 1 month with :Norma Fredrickson NP  Your physician recommends that you schedule a follow-up appointment in: 3 months with Dr Elease Hashimoto  Your physician has recommended you make the following change in your medication:    START SIMVASTATIN 40 MG DAILY START CARDURA 4 MG DAILY  Your physician recommends that you return for a FASTING lipid profile: 3 MONTHS  BMET LIPID HEPATIC

## 2011-11-06 NOTE — Assessment & Plan Note (Signed)
Clayton seems to be doing well from a cardiac standpoint. His blood pressure is fairly elevated today. We'll add Cardura 4 mg a day. He reminded [place] that beta blockers and calcium channel blockers could exacerbate myasthenia gravis. This will limit some of our options.  We'll have him return to see Lawson Fiscal  in one month. I'll see him again in 3 months for followup visit.

## 2011-11-06 NOTE — Progress Notes (Signed)
Stephen Mendoza Date of Birth  03/17/1940       [facility]    [place] [address]  [address] [place], [place]  [address]   [place], [place]  [address] [phone]     [phone]   Fax  [phone]    Fax [phone]  Problem List: 1. Aortic stenosis- status post aortic valve replacement, myomectomy,  and coronary artery bypass grafting 2. Coronary artery disease 3. Hypertension 4. Myasthenia gravis 5. Hypertension   History of Present Illness:  Stephen Mendoza is a 71 yo with hx of AS - s/p CABG, AVR, septak myomectomy.  He was diagnosed with myasthenia gravis and is now on Mestinon.  He recenly had IG antibodies which had helped quite a bit.  His blood pressure remains high. Since his diagnosis of myasthenia gravis we have tried to avoid beta blockers and calcium channel blockers. He's not had any episodes of chest pain or shortness of  breath  Current Outpatient Prescriptions on File Prior to Visit  Medication Sig Dispense Refill  . aspirin 325 MG tablet Take 325 mg by mouth daily.      . Coenzyme Q10 (CO Q 10) 100 MG CAPS Take 100 mg by mouth 2 (two) times daily.       Marland Kitchen losartan-hydrochlorothiazide (HYZAAR) 100-25 MG per tablet Take 1 tablet by mouth daily.        . Omega-3 Fatty Acids (FISH OIL PO) Take 1,600 mg by mouth 2 (two) times daily.        Marland Kitchen pyridostigmine (MESTINON) 60 MG tablet Take 60 mg by mouth 3 (three) times daily.       Marland Kitchen pyridOXINE (B-6) 50 MG tablet Take 50 mg by mouth daily.      . clopidogrel (PLAVIX) 75 MG tablet Take 75 mg by mouth daily.       . Multiple Vitamin (MULTIVITAMIN) capsule Take 1 capsule by mouth daily.          No Known Allergies  Past Medical History  Diagnosis Date  . Hypertension     Essential  . Aortic stenosis     severe  . Palpitations   . Aortic regurgitation   . Mitral regurgitation     Mild moderate  . Left atrial enlargement   . Polio osteopathy of lower leg   .  Benign prostatic hypertrophy (BPH) with nocturia   . PONV (postoperative nausea and vomiting)   . Shortness of breath   . S/P aortic valve replacement with bioprosthetic valve 07/22/2011    21mm [facility] Ease pericardial tissue valve  . 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 Coronary artery disease   . GERD (gastroesophageal reflux disease)   . Myasthenia gravis 09/21/2011    Past Surgical History  Procedure Date  . Cataract extraction   . Cardiac catheterization   . Coronary artery bypass graft 07/22/2011  . Aortic valve replacement 07/22/2011    History  Smoking status  . Former Smoker  . Types: Cigarettes  . Quit date: 10/24/1970  Smokeless tobacco  . Never Used    History  Alcohol Use No    Family History  Problem Relation Age of Onset  . Heart attack Father     Reviw of Systems:  Reviewed in the HPI.  All other systems are negative.  Physical Exam: Blood pressure 175/99, pulse 70, resp. rate  18, weight 169 lb 3.2 oz (76.749 kg), SpO2 94.00%. General: Well developed, well nourished, in no acute distress.  Head: Normocephalic, atraumatic, sclera non-icteric, mucus membranes are moist,   Neck: Supple. Carotids are 2 + without bruits. No JVD.   Lungs: Clear bilaterally to auscultation.  Heart: regular rate.  normal  S1 S2. There is a 1-2/6 systolic murmur.  His sternotomy scar is healing well.  Abdomen: Soft, non-tender, non-distended with normal bowel sounds. No hepatomegaly. No rebound/guarding. No masses.  Msk:  Strength and tone are normal  Extremities: No clubbing or cyanosis. No edema.  Distal pedal pulses are 1+ and equal bilaterally.  Neuro: Alert and oriented X 3. Moves all extremities spontaneously.  Psych:  Responds to questions appropriately with a normal affect.  ECG:  Assessment / Plan:

## 2011-12-07 ENCOUNTER — Ambulatory Visit (INDEPENDENT_AMBULATORY_CARE_PROVIDER_SITE_OTHER): Payer: Medicare Other | Admitting: Nurse Practitioner

## 2011-12-07 ENCOUNTER — Encounter: Payer: Self-pay | Admitting: Nurse Practitioner

## 2011-12-07 VITALS — BP 138/76 | HR 64 | Ht 68.0 in | Wt 171.4 lb

## 2011-12-07 DIAGNOSIS — I10 Essential (primary) hypertension: Secondary | ICD-10-CM

## 2011-12-07 NOTE — Progress Notes (Signed)
[name] Date of Birth: 02-03-1940 Medical Record #[id number]  History of Present Illness: Aqil is seen back today for a one month check. He is seen for Dr. Elease Hashimoto. He has had CAD/AS and has had CABG x 2 with AVR with LIMA to the LAD, SVG to PD, septal myomectomy and a #54mm [employer] tissue valve. Other problems include HTN and BPH. He was diagnosed with myasthenia. He probably had symptoms prior to his surgery but was diagnosed afterwards. Now on Mestinon and has had IG antibodies.   Saw Dr. Elease Hashimoto last month and BP was up. He is not able to have CCB or BB therapy due to his myasthenia. Cardura was started last month.  He comes in today. He is here with his wife. He is doing so much better than the last time I saw him. He says he is back to his baseline. Doing all of his ADL's without any problems. BP at home is even better than here. He is quite happy. He is very reluctant to start statin therapy. He is for fasting labs here in January. He is asking about a lipomed and a CRP. No chest pain. Not dizzy. No neuro problems.   Current Outpatient Prescriptions on File Prior to Visit  Medication Sig Dispense Refill  . aspirin 325 MG tablet Take 325 mg by mouth daily.      . Coenzyme Q10 (CO Q 10) 100 MG CAPS Take 100 mg by mouth daily.       Marland Kitchen doxazosin (CARDURA) 4 MG tablet Take 1 tablet (4 mg total) by mouth at bedtime.  30 tablet  5  . losartan-hydrochlorothiazide (HYZAAR) 100-25 MG per tablet Take 1 tablet by mouth daily.        . Magnesium 500 MG CAPS Take 1 capsule by mouth daily.      . Omega-3 Fatty Acids (FISH OIL PO) Take 1,600 mg by mouth daily.       Marland Kitchen pyridostigmine (MESTINON) 60 MG tablet Take 30 mg by mouth 3 (three) times daily.       Marland Kitchen pyridOXINE (B-6) 50 MG tablet Take 50 mg by mouth daily. 1 tsp daily        No Known Allergies  Past Medical History  Diagnosis Date  . Hypertension     Essential  . Aortic stenosis     severe  . Palpitations   . Aortic  regurgitation   . Mitral regurgitation     Mild moderate  . Left atrial enlargement   . Polio osteopathy of lower leg   . Benign prostatic hypertrophy (BPH) with nocturia   . PONV (postoperative nausea and vomiting)   . Shortness of breath   . S/P aortic valve replacement with bioprosthetic valve 07/22/2011    21mm [facility] Ease pericardial tissue valve  . 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 Coronary artery disease   . GERD (gastroesophageal reflux disease)   . Myasthenia gravis 09/21/2011    Past Surgical History  Procedure Date  . Cataract extraction   . Cardiac catheterization   . Coronary artery bypass graft 07/22/2011  . Aortic valve replacement 07/22/2011    History  Smoking status  . Former Smoker  . Types: Cigarettes  . Quit date: 10/24/1970  Smokeless tobacco  . Never Used    History  Alcohol Use No    Family History  Problem Relation Age of  Onset  . Heart attack Father     Review of Systems: The review of systems is per the HPI.  All other systems were reviewed and are negative.  Physical Exam: BP 138/76  Pulse 64  Ht 5\' 8"  (1.727 m)  Wt 171 lb 6.4 oz (77.747 kg)  BMI 26.06 kg/m2 Patient is very pleasant and in no acute distress. Skin is warm and dry. Color is normal.  HEENT is unremarkable. Normocephalic/atraumatic. PERRL. Sclera are nonicteric. Neck is supple. No masses. No JVD. Lungs are clear. Cardiac exam shows a regular rate and rhythm. Sternum looks ok. Does have a keloid at the lower aspect of his incision. Soft outflow murmur noted. Abdomen is soft. Extremities are without edema. Gait and ROM are intact. No gross neurologic deficits noted.  LABORATORY DATA: n/a  Lab Results  Component Value Date   WBC 6.5 09/15/2011   HGB 12.8* 09/15/2011   HCT 39.7 09/15/2011   PLT 244.0 09/15/2011   GLUCOSE 104* 09/15/2011   CHOL 218* 09/15/2011   TRIG 67.0 09/15/2011   HDL 64.10 09/15/2011   LDLDIRECT 140.3 09/15/2011   ALT  15 09/15/2011   AST 19 09/15/2011   NA 139 09/15/2011   K 3.7 09/15/2011   CL 99 09/15/2011   CREATININE 0.7 09/15/2011   BUN 9 09/15/2011   CO2 30 09/15/2011   INR 1.53* 07/22/2011   HGBA1C 5.7* 07/20/2011    Echo Study Conclusions from September 2013  - Left ventricle: The cavity size was normal. Wall thickness was increased in a pattern of mild LVH. Systolic function was normal. The estimated ejection fraction was in the range of 55% to 65%. Wall motion was normal; there were no regional wall motion abnormalities. Doppler parameters are consistent with abnormal left ventricular relaxation (grade 1 diastolic dysfunction). - Aortic valve: A was present and functioning normally. Mean gradient: 13mm Hg (S). Peak gradient: 27mm Hg (S). - Mitral valve: Mild regurgitation. - Left atrium: The atrium was mildly dilated. - Right atrium: The atrium was mildly dilated. - Pulmonary arteries: Systolic pressure was mildly increased. PA peak pressure: 34mm Hg (S).   Assessment / Plan: 1. S/P CABG/AVR from July 2013 - doing very well. He does not feel like he needs to see Dr. Cornelius Moras since he has recovered quite nicely. I think that will be fine.   2. Myasthenia - on treatment - no problems noted  3. HTN - blood pressure has improved quite nicely with his current regimen. Will stay away from BB or CCB therapy.  4. HLD - he is for fasting labs in January. I do not think he needs a CRP or lipomed. We already know he has CAD. I don't think this is going to change our treatment plan and he is not wanting to have statin therapy. I have asked him to consider Zetia or Welchol. He will discuss with Dr. Elease Hashimoto in January but is favoring a program of diet/exercise.   Patient is agreeable to this plan and will call if any problems develop in the interim.

## 2011-12-07 NOTE — Patient Instructions (Addendum)
I think you are doing well  Stay on your current medicines  Check labs in January  Consider Zetia or Welchol in the future  I will let Dr. Cornelius Moras know that we are going to cancel your visit with him next month  Call the [facility] Care office at [phone] if you have any questions, problems or concerns.

## 2011-12-28 ENCOUNTER — Ambulatory Visit: Payer: Self-pay | Admitting: Thoracic Surgery (Cardiothoracic Vascular Surgery)

## 2012-02-01 ENCOUNTER — Encounter: Payer: Self-pay | Admitting: Cardiovascular Disease

## 2012-02-01 ENCOUNTER — Ambulatory Visit (INDEPENDENT_AMBULATORY_CARE_PROVIDER_SITE_OTHER): Payer: Medicaid Other | Admitting: Cardiovascular Disease

## 2012-02-01 ENCOUNTER — Ambulatory Visit (INDEPENDENT_AMBULATORY_CARE_PROVIDER_SITE_OTHER): Payer: Medicare Other | Admitting: *Deleted

## 2012-02-01 VITALS — BP 142/86 | HR 64 | Ht 68.0 in | Wt 173.0 lb

## 2012-02-01 DIAGNOSIS — Z952 Presence of prosthetic heart valve: Secondary | ICD-10-CM

## 2012-02-01 DIAGNOSIS — E785 Hyperlipidemia, unspecified: Secondary | ICD-10-CM

## 2012-02-01 DIAGNOSIS — Z953 Presence of xenogenic heart valve: Secondary | ICD-10-CM

## 2012-02-01 LAB — HEPATIC FUNCTION PANEL
Bilirubin, Direct: 0 mg/dL (ref 0.0–0.3)
Total Bilirubin: 0.7 mg/dL (ref 0.3–1.2)

## 2012-02-01 LAB — BASIC METABOLIC PANEL
Calcium: 9.2 mg/dL (ref 8.4–10.5)
Chloride: 99 mEq/L (ref 96–112)
Creatinine, Ser: 0.7 mg/dL (ref 0.4–1.5)
GFR: 113.16 mL/min (ref >60.00)

## 2012-02-01 NOTE — Assessment & Plan Note (Signed)
Ren has hyperlipidemia but does not want take a statin. We will draw a Lipomed profile to evaluate what his LDL particle number as. We'll make further decisions based on that number. His goal particle number is less than 1000.

## 2012-02-01 NOTE — Progress Notes (Signed)
[name] Date of Birth  1940/07/31       [facility]    [place] [address]  [address] [place], [place]  [address]   [place], [place]  [address] [phone]     [phone]   Fax  [phone]    Fax [phone]  Problem List: 1. Aortic stenosis- status post aortic valve replacement, myomectomy,  and coronary artery bypass grafting ( LIMA to LAD and SVG to PDA) 2. Coronary artery disease 3. Hypertension 4. Myasthenia gravis 5. Hypertension   History of Present Illness:  Jorell is a 72 yo with hx of AS - s/p CABG, AVR, septak myomectomy.  He was diagnosed with myasthenia gravis and is now on Mestinon.  He recenly had IG antibodies which had helped quite a bit.  His blood pressure remains high. Since his diagnosis of myasthenia gravis we have tried to avoid beta blockers and calcium channel blockers. He's not had any episodes of chest pain or shortness of  Breath  February 01, 2012: Jathan is doing very well from a cardiac standpoint. He's not had any episodes of chest pain or shortness breath.  He has developed some keloid formation at the lower aspect of his sternotomy.  Current Outpatient Prescriptions on File Prior to Visit  Medication Sig Dispense Refill  . aspirin 325 MG tablet Take 325 mg by mouth daily.      . Coenzyme Q10 (CO Q 10) 100 MG CAPS Take 100 mg by mouth daily.       Marland Kitchen doxazosin (CARDURA) 4 MG tablet Take 1 tablet (4 mg total) by mouth at bedtime.  30 tablet  5  . losartan-hydrochlorothiazide (HYZAAR) 100-25 MG per tablet Take 1 tablet by mouth daily.        . Magnesium 500 MG CAPS Take 1 capsule by mouth daily.      . Multiple Vitamins-Minerals (DESICCATED LIVER/B-12 PO) Take by mouth 3 (three) times daily.      . Omega-3 Fatty Acids (FISH OIL PO) Take 1,600 mg by mouth daily.       Marland Kitchen pyridostigmine (MESTINON) 60 MG tablet Take 30 mg by mouth 3 (three) times daily.         No Known  Allergies  Past Medical History  Diagnosis Date  . Hypertension     Essential  . Aortic stenosis     severe  . Palpitations   . Aortic regurgitation   . Mitral regurgitation     Mild moderate  . Left atrial enlargement   . Polio osteopathy of lower leg   . Benign prostatic hypertrophy (BPH) with nocturia   . PONV (postoperative nausea and vomiting)   . Shortness of breath   . S/P aortic valve replacement with bioprosthetic valve 07/22/2011    21mm [facility] Ease pericardial tissue valve  . 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 Coronary artery disease   . GERD (gastroesophageal reflux disease)   . Myasthenia gravis 09/21/2011    Past Surgical History  Procedure Date  . Cataract extraction   . Cardiac catheterization   . Coronary artery bypass graft 07/22/2011  . Aortic valve replacement 07/22/2011    History  Smoking status  . Former Smoker  . Types: Cigarettes  . Quit date: 10/24/1970  Smokeless tobacco  . Never Used    History  Alcohol Use No  Family History  Problem Relation Age of Onset  . Heart attack Father     Reviw of Systems:  Reviewed in the HPI.  All other systems are negative.  Physical Exam: Blood pressure 142/86, pulse 64, height 5\' 8"  (1.727 m), weight 173 lb (78.472 kg). General: Well developed, well nourished, in no acute distress.  Head: Normocephalic, atraumatic, sclera non-icteric, mucus membranes are moist,   Neck: Supple. Carotids are 2 + without bruits. No JVD.   Lungs: Clear bilaterally to auscultation.  Heart: regular rate.  normal  S1 S2. There is a 1-2/6 systolic murmur.  His sternotomy scar is healing well.  Abdomen: Soft, non-tender, non-distended with normal bowel sounds. No hepatomegaly. No rebound/guarding. No masses.  Msk:  Strength and tone are normal  Extremities: No clubbing or cyanosis. No edema.  Distal pedal pulses are 1+ and equal bilaterally.  Neuro: Alert and oriented  X 3. Moves all extremities spontaneously.  Psych:  Responds to questions appropriately with a normal affect.  ECG:  Assessment / Plan:

## 2012-02-01 NOTE — Patient Instructions (Addendum)
Goal LDL particle number of < 1000.  This will be part of the Lipomed profile.  Your physician wants you to follow-up in: 6 Months You will receive a reminder letter in the mail two months in advance. If you don't receive a letter, please call our office to schedule the follow-up appointment.  Your physician recommends that you continue on your current medications as directed. Please refer to the Current Medication list given to you today.  Your physician recommends that you return for a FASTING lipid profile: Today

## 2012-02-01 NOTE — Assessment & Plan Note (Signed)
He is doing very well from a cardiac standpoint.

## 2012-02-03 ENCOUNTER — Telehealth: Payer: Self-pay | Admitting: *Deleted

## 2012-02-03 LAB — NMR LIPOPROFILE WITH LIPIDS
Cholesterol, Total: 236 mg/dL — ABNORMAL HIGH (ref <200)
HDL Particle Number: 34.3 umol/L (ref >=30.5)
HDL-C: 74 mg/dL (ref >=40)
LDL (calc): 152 mg/dL — ABNORMAL HIGH (ref <100)
LP-IR Score: <25 (ref <=45)
Triglycerides: 50 mg/dL (ref <150)
VLDL Size: 39.3 nm (ref >=46.6)

## 2012-02-03 MED ORDER — DOXAZOSIN MESYLATE 4 MG PO TABS: 2.0000 mg | ORAL_TABLET | Freq: Every day | ORAL | Status: DC

## 2012-02-03 MED ORDER — POTASSIUM CHLORIDE CRYS ER 10 MEQ PO TBCR: 10.0000 meq | EXTENDED_RELEASE_TABLET | Freq: Every day | ORAL | Status: DC

## 2012-02-03 MED ORDER — DOXAZOSIN MESYLATE 2 MG PO TABS: 2.0000 mg | ORAL_TABLET | Freq: Every day | ORAL | Status: DC

## 2012-02-03 NOTE — Telephone Encounter (Signed)
Message copied by Antony Odea on Wed Feb 03, 2012  3:36 PM ------      Message from: [name]      Created: Mon Feb 01, 2012  6:03 PM       His labs are overall okay. His potassium is low and he is on HCTZ 25 mg a day. We'll add potassium chloride 10 mEq a day.

## 2012-02-03 NOTE — Telephone Encounter (Signed)
Pt called with lab results, kdur script sent, written script mailed for bmet in 1 month, pt will have drawn in [place] and faxed. Pt verbalized understanding.

## 2012-02-03 NOTE — Telephone Encounter (Signed)
Fax Received. Refill Completed. Lizania Bouchard Chowoe (R.M.A)   

## 2012-02-04 ENCOUNTER — Telehealth: Payer: Self-pay | Admitting: Cardiovascular Disease

## 2012-02-04 NOTE — Telephone Encounter (Signed)
Pt rtn your call

## 2012-02-04 NOTE — Telephone Encounter (Signed)
Lab results given, pt declined medication/ atorvastatin at this time. See lab note.

## 2012-02-27 ENCOUNTER — Other Ambulatory Visit: Payer: Self-pay

## 2012-07-25 ENCOUNTER — Encounter: Payer: Self-pay | Admitting: Cardiovascular Disease

## 2012-07-25 ENCOUNTER — Ambulatory Visit (INDEPENDENT_AMBULATORY_CARE_PROVIDER_SITE_OTHER): Payer: Medicare Other | Admitting: Cardiovascular Disease

## 2012-07-25 VITALS — BP 152/86 | HR 56 | Ht 68.0 in | Wt 180.0 lb

## 2012-07-25 DIAGNOSIS — I359 Nonrheumatic aortic valve disorder, unspecified: Secondary | ICD-10-CM

## 2012-07-25 DIAGNOSIS — E785 Hyperlipidemia, unspecified: Secondary | ICD-10-CM

## 2012-07-25 DIAGNOSIS — Z952 Presence of prosthetic heart valve: Secondary | ICD-10-CM

## 2012-07-25 DIAGNOSIS — I350 Nonrheumatic aortic (valve) stenosis: Secondary | ICD-10-CM

## 2012-07-25 DIAGNOSIS — Z953 Presence of xenogenic heart valve: Secondary | ICD-10-CM

## 2012-07-25 LAB — BASIC METABOLIC PANEL
BUN: 12 mg/dL (ref 6–23)
Creatinine, Ser: 0.8 mg/dL (ref 0.4–1.5)
GFR: 104.69 mL/min (ref >60.00)
Glucose, Bld: 105 mg/dL — ABNORMAL HIGH (ref 70–99)
Potassium: 3.7 mEq/L (ref 3.5–5.1)

## 2012-07-25 LAB — HEPATIC FUNCTION PANEL
ALT: 20 U/L (ref 0–53)
AST: 22 U/L (ref 0–37)
Alkaline Phosphatase: 49 U/L (ref 39–117)
Bilirubin, Direct: 0 mg/dL (ref 0.0–0.3)
Total Bilirubin: 0.6 mg/dL (ref 0.3–1.2)

## 2012-07-25 NOTE — Assessment & Plan Note (Signed)
He is doing well.  I think he has made good progress.

## 2012-07-25 NOTE — Patient Instructions (Addendum)
Your physician wants you to follow-up in: 6 MONTHS  You will receive a reminder letter in the mail two months in advance. If you don't receive a letter, please call our office to schedule the follow-up appointment.  Your physician recommends that you return for a FASTING lipid profile: TODAY AND IN 6 MONTHS  

## 2012-07-25 NOTE — Assessment & Plan Note (Addendum)
His last LDL particle number is [id number].  Will recheck these again today and again in 6 months.   I have recommended atorvastatin but he did not want to start.

## 2012-07-25 NOTE — Progress Notes (Signed)
Stephen Mendoza Date of Birth  10-Jan-1941       [facility]    [place] [address]  [address] [place], [place]  [address]   [place], [place]  [address] [phone]     [phone]   Fax  [phone]    Fax [phone]  Problem List: 1. Aortic stenosis- status post aortic valve replacement, myomectomy,  and coronary artery bypass grafting ( LIMA to LAD and SVG to PDA) 2. Coronary artery disease 3. Hypertension 4. Myasthenia gravis 5. Hypertension   History of Present Illness:  Stephen Mendoza is a 72 yo with hx of AS - s/p CABG, AVR, septak myomectomy.  He was diagnosed with myasthenia gravis and is now on Mestinon.  He recenly had IG antibodies which had helped quite a bit.  His blood pressure remains high. Since his diagnosis of myasthenia gravis we have tried to avoid beta blockers and calcium channel blockers. He's not had any episodes of chest pain or shortness of  Breath  February 01, 2012: Tally is doing very well from a cardiac standpoint. He's not had any episodes of chest pain or shortness breath.  He has developed some keloid formation at the lower aspect of his sternotomy.  July 25, 2012:  [name] is doing well.  He is here with his wife Stephen Mendoza.  He just reached his 1 year anniversary of his heart surgery.  He has some irritation of his sternotomy scar.    Current Outpatient Prescriptions on File Prior to Visit  Medication Sig Dispense Refill  . aspirin 325 MG tablet Take 325 mg by mouth daily.      . Coenzyme Q10 (CO Q 10) 100 MG CAPS Take 100 mg by mouth daily.       Marland Kitchen doxazosin (CARDURA) 2 MG tablet Take 1 tablet (2 mg total) by mouth at bedtime.  30 tablet  5  . losartan-hydrochlorothiazide (HYZAAR) 100-25 MG per tablet Take 1 tablet by mouth daily.        . potassium chloride (K-DUR,KLOR-CON) 10 MEQ tablet Take 1 tablet (10 mEq total) by mouth daily.  30 tablet  11  . pyridostigmine (MESTINON) 60 MG tablet Take 30 mg  by mouth 3 (three) times daily.       . Multiple Vitamins-Minerals (DESICCATED LIVER/B-12 PO) Take by mouth 3 (three) times daily.       No current facility-administered medications on file prior to visit.    No Known Allergies  Past Medical History  Diagnosis Date  . Hypertension     Essential  . Aortic stenosis     severe  . Palpitations   . Aortic regurgitation   . Mitral regurgitation     Mild moderate  . Left atrial enlargement   . Polio osteopathy of lower leg   . Benign prostatic hypertrophy (BPH) with nocturia   . PONV (postoperative nausea and vomiting)   . Shortness of breath   . S/P aortic valve replacement with bioprosthetic valve 07/22/2011    21mm [facility] Ease pericardial tissue valve  . 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 Coronary artery disease   . GERD (gastroesophageal reflux disease)   . Myasthenia gravis 09/21/2011    Past Surgical History  Procedure Laterality Date  . Cataract extraction    . Cardiac catheterization    . Coronary artery bypass graft  07/22/2011  .  Aortic valve replacement  07/22/2011    History  Smoking status  . Former Smoker  . Types: Cigarettes  . Quit date: 10/24/1970  Smokeless tobacco  . Never Used    History  Alcohol Use No    Family History  Problem Relation Age of Onset  . Heart attack Father     Reviw of Systems:  Reviewed in the HPI.  All other systems are negative.  Physical Exam: Blood pressure 152/86, pulse 56, height 5\' 8"  (1.727 m), weight 180 lb (81.647 kg). General: Well developed, well nourished, in no acute distress.  Head: Normocephalic, atraumatic, sclera non-icteric, mucus membranes are moist,   Neck: Supple. Carotids are 2 + without bruits. No JVD.   Lungs: Clear bilaterally to auscultation.  Heart: regular rate.  normal  S1 S2. There is a 1-2/6 systolic murmur.  His sternotomy scar is healing well.  He has some keloid in the lower aspect of his  wound.    Abdomen: Soft, non-tender, non-distended with normal bowel sounds. No hepatomegaly. No rebound/guarding. No masses.  Msk:  Strength and tone are normal  Extremities: No clubbing or cyanosis. No edema.  Distal pedal pulses are 1+ and equal bilaterally.  Neuro: Alert and oriented X 3. Moves all extremities spontaneously.  Psych:  Responds to questions appropriately with a normal affect.  ECG: 07/25/2012: Sinus bradycardia 56 beats a minute. He is a possible septal myocardial infarction vs. Lead placement abnormality.     Assessment / Plan:

## 2012-07-26 LAB — NMR LIPOPROFILE WITH LIPIDS
HDL Particle Number: 35.2 umol/L (ref >=30.5)
HDL-C: 75 mg/dL (ref >=40)
LDL (calc): 196 mg/dL — ABNORMAL HIGH (ref <100)
LDL Particle Number: 1672 nmol/L — ABNORMAL HIGH (ref <1000)
LDL Size: 21.7 nm (ref >20.5)
LP-IR Score: <25 (ref <=45)
Small LDL Particle Number: <90 nmol/L (ref <=527)
VLDL Size: 37.3 nm (ref <=46.6)

## 2012-07-28 ENCOUNTER — Telehealth: Payer: Self-pay | Admitting: *Deleted

## 2012-07-28 DIAGNOSIS — E785 Hyperlipidemia, unspecified: Secondary | ICD-10-CM

## 2012-07-28 NOTE — Telephone Encounter (Signed)
Message copied by Antony Odea on Thu Jul 28, 2012  5:13 PM ------      Message from: [name]      Created: Thu Jul 28, 2012  9:07 AM       The LDL  particle number still elevated-[id number]. A recommendation is for him to start atorvastatin as noted in the last lipid noted.  Atorvastatin 40 mg day. Recheck labs in 3 months.  Also check lipomed profile       ------

## 2012-07-28 NOTE — Telephone Encounter (Signed)
DECLINES MEDS AT THIS TIME/ 6 MO LAB DATE GIVEN

## 2012-08-03 ENCOUNTER — Other Ambulatory Visit: Payer: Self-pay

## 2012-08-03 MED ORDER — DOXAZOSIN MESYLATE 2 MG PO TABS: 2.0000 mg | ORAL_TABLET | Freq: Every day | ORAL | Status: DC

## 2012-08-03 NOTE — Telephone Encounter (Signed)
OK to refill per Deborah Jones 

## 2012-08-04 ENCOUNTER — Other Ambulatory Visit: Payer: Self-pay | Admitting: *Deleted

## 2012-08-04 MED ORDER — POTASSIUM CHLORIDE CRYS ER 10 MEQ PO TBCR: 10.0000 meq | EXTENDED_RELEASE_TABLET | Freq: Every day | ORAL | Status: DC

## 2012-08-17 ENCOUNTER — Other Ambulatory Visit: Payer: Self-pay

## 2012-11-17 ENCOUNTER — Other Ambulatory Visit: Payer: Self-pay

## 2013-01-03 IMAGING — CR DG CHEST 2V
2 series · 2 of 2 positions shown · non-contrast
Comparison: 07/24/2011

CLINICAL DATA: CABG and aortic valve replacement.

CHEST - 2 VIEW

[w chest pa]
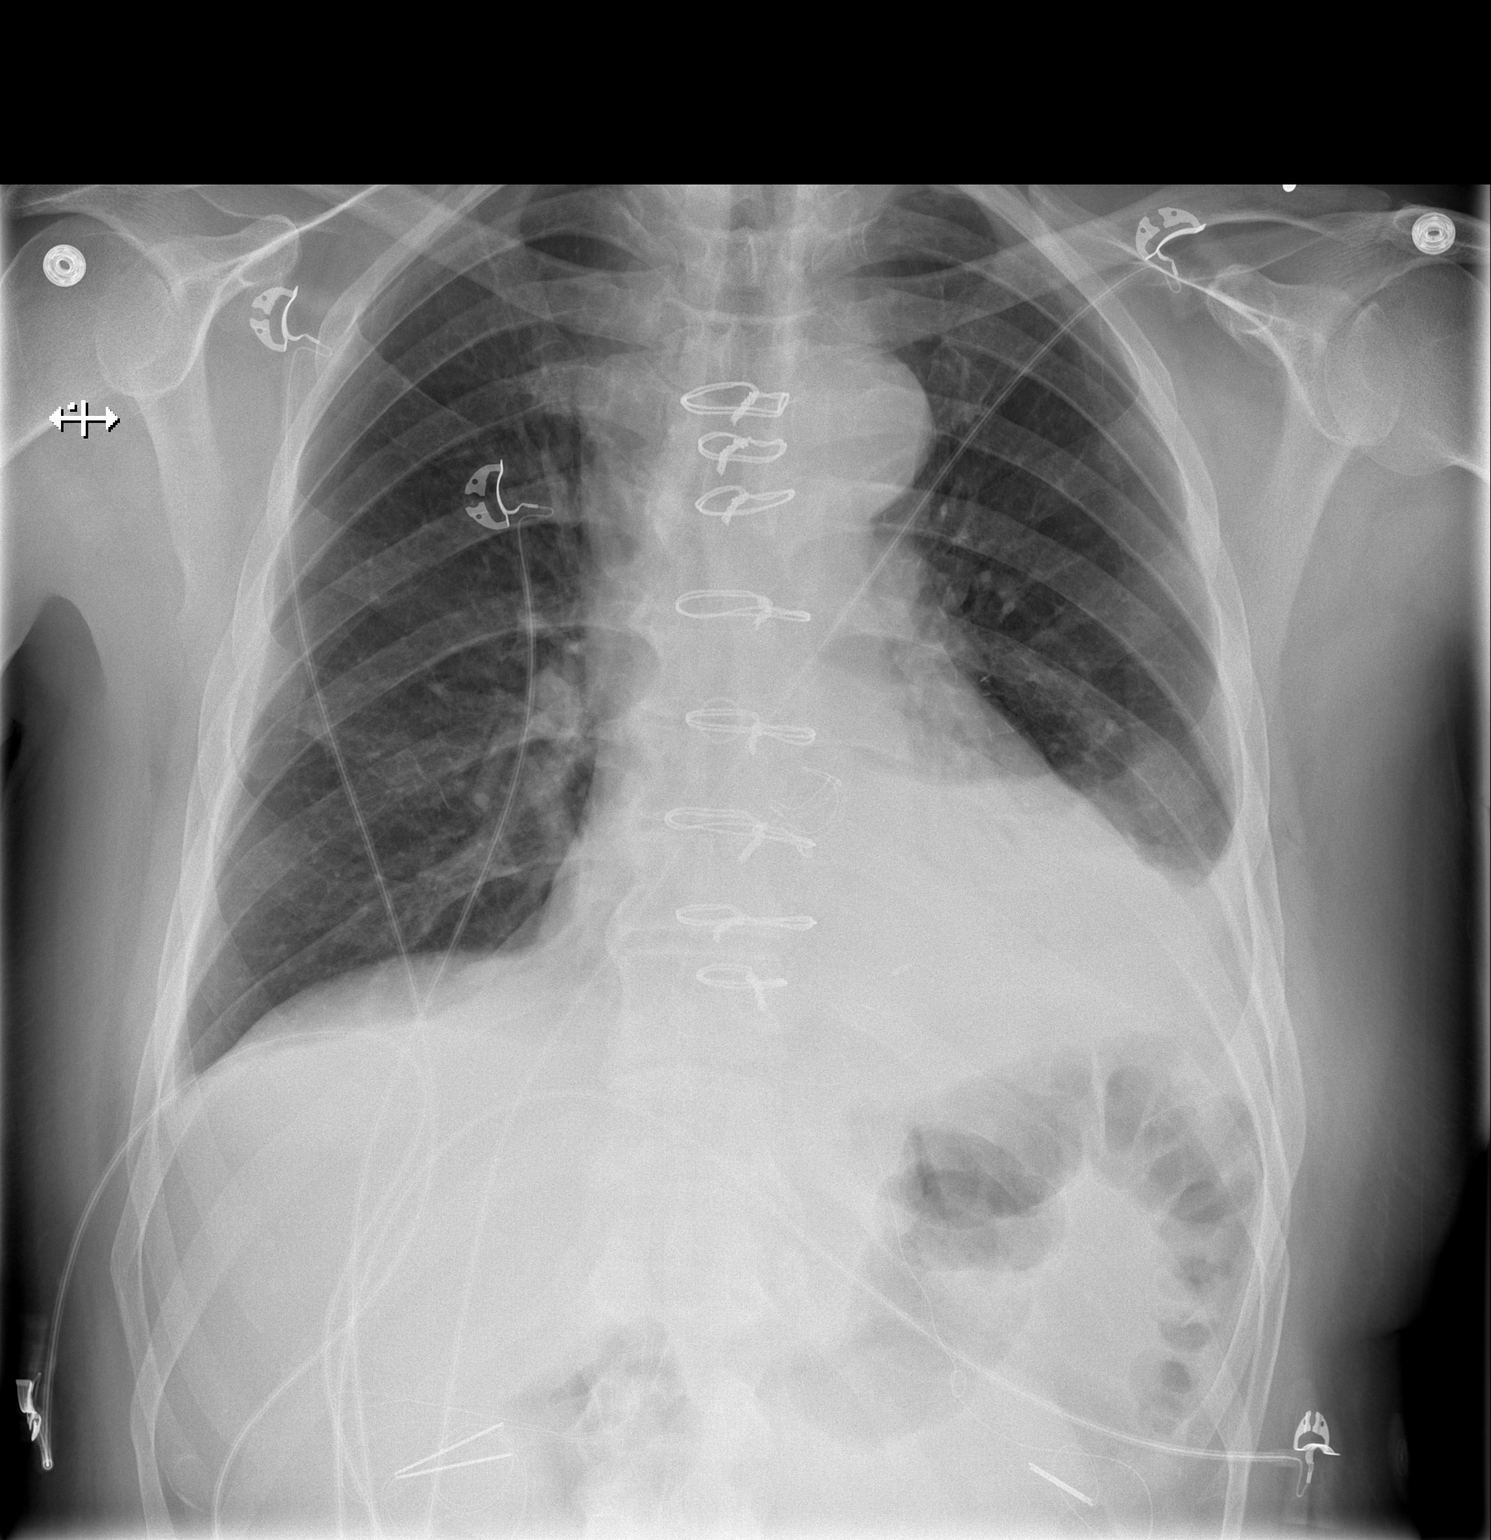

[w chest lat]
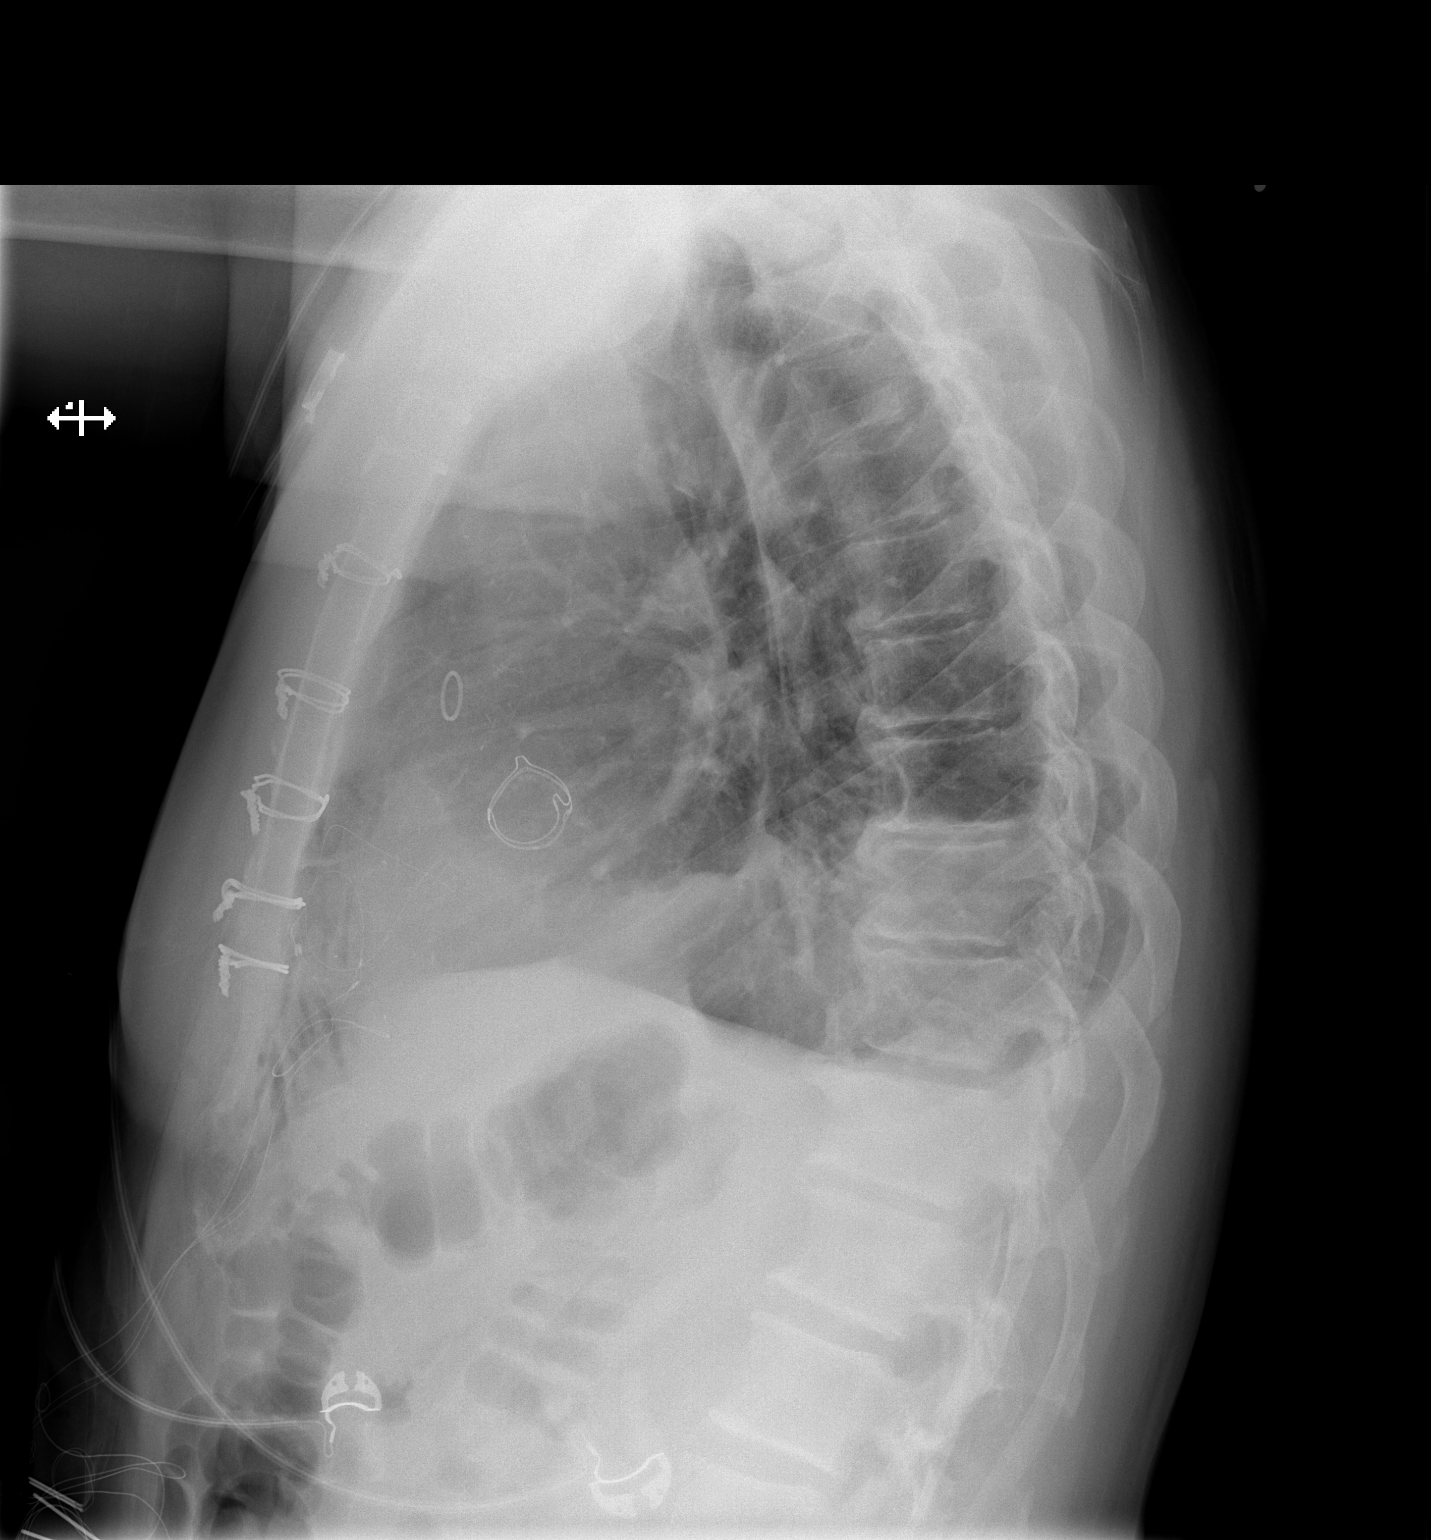

[2 of 2 positions shown; findings below may reference images not displayed]

FINDINGS: Two views of the chest demonstrate a small left pleural
effusion with compressive atelectasis at the left lung base.  There
may be a tiny right pleural effusion.  There is no evidence for a
pneumothorax.  Again noted is a prominent aortic arch which is
unchanged.  The right jugular central venous catheter has been
removed.  Epicardial pacer wires are present.  Evidence for an
aortic valve replacement.
IMPRESSION: Small left pleural effusion with compressive atelectasis or left
basilar consolidation.

Negative for pneumothorax.

## 2013-01-25 ENCOUNTER — Ambulatory Visit: Payer: Self-pay | Admitting: Cardiovascular Disease

## 2013-01-25 ENCOUNTER — Other Ambulatory Visit: Payer: Self-pay

## 2013-01-26 ENCOUNTER — Other Ambulatory Visit: Payer: Medicare Other

## 2013-01-26 ENCOUNTER — Ambulatory Visit: Payer: Medicare Other | Admitting: Cardiovascular Disease

## 2013-01-30 ENCOUNTER — Other Ambulatory Visit: Payer: Self-pay | Admitting: Cardiovascular Disease

## 2013-02-22 ENCOUNTER — Encounter: Payer: Self-pay | Admitting: Cardiovascular Disease

## 2013-02-22 ENCOUNTER — Ambulatory Visit (INDEPENDENT_AMBULATORY_CARE_PROVIDER_SITE_OTHER): Payer: 59 | Admitting: Cardiovascular Disease

## 2013-02-22 VITALS — BP 138/80 | HR 60 | Ht 68.0 in | Wt 157.8 lb

## 2013-02-22 DIAGNOSIS — I350 Nonrheumatic aortic (valve) stenosis: Secondary | ICD-10-CM

## 2013-02-22 DIAGNOSIS — I2510 Atherosclerotic heart disease of native coronary artery without angina pectoris: Secondary | ICD-10-CM

## 2013-02-22 DIAGNOSIS — I359 Nonrheumatic aortic valve disorder, unspecified: Secondary | ICD-10-CM

## 2013-02-22 DIAGNOSIS — E785 Hyperlipidemia, unspecified: Secondary | ICD-10-CM

## 2013-02-22 DIAGNOSIS — I10 Essential (primary) hypertension: Secondary | ICD-10-CM

## 2013-02-22 LAB — HEPATIC FUNCTION PANEL
ALBUMIN: 4.5 g/dL (ref 3.5–5.2)
ALT: 21 U/L (ref 0–53)
AST: 23 U/L (ref 0–37)
Alkaline Phosphatase: 54 U/L (ref 39–117)
BILIRUBIN TOTAL: 1.2 mg/dL (ref 0.3–1.2)
Bilirubin, Direct: 0.1 mg/dL (ref 0.0–0.3)
Total Protein: 7.3 g/dL (ref 6.0–8.3)

## 2013-02-22 LAB — BASIC METABOLIC PANEL
BUN: 9 mg/dL (ref 6–23)
CALCIUM: 9.4 mg/dL (ref 8.4–10.5)
CHLORIDE: 102 meq/L (ref 96–112)
CO2: 31 meq/L (ref 19–32)
Creatinine, Ser: 0.7 mg/dL (ref 0.4–1.5)
GFR: 120.39 mL/min (ref >60.00)
GLUCOSE: 106 mg/dL — AB (ref 70–99)
POTASSIUM: 3.5 meq/L (ref 3.5–5.1)
SODIUM: 141 meq/L (ref 135–145)

## 2013-02-22 NOTE — Patient Instructions (Signed)
Your physician wants you to follow-up in: 6 MONTHS WITH DR Elease HashimotoNAHSER You will receive a reminder letter in the mail two months in advance. If you don't receive a letter, please call our office to schedule the follow-up appointment.   Your physician recommends that you HAVE LAB WORK TODAY  Your physician recommends that you return for lab work in: 6 MONTHS PRIOR TO APPOINTMENT

## 2013-02-22 NOTE — Assessment & Plan Note (Signed)
Has a history of hyperlipidemia. He does not want to take statins. He tried to lower his cholesterol levels using diet and exercise. He has lost about 30 pounds over the past several years. We will draw fasting lipids today as well as the Lipomed  Profile.    I will see him in 6 months.

## 2013-02-22 NOTE — Assessment & Plan Note (Signed)
He's doing well following his aortic valve replacement.  He has a bioprosthetic valve and so does not need to take Coumadin. I have recommended that he restart his aspirin 81 mg a day.

## 2013-02-22 NOTE — Progress Notes (Signed)
Stephen Mendoza Date of Birth  [birth year]       [facility]    [place] [address]  [address] [place], [place]  [address]   [place], [place]  [address] [phone]     [phone]   Fax  [phone]    Fax [phone]  Problem List: 1. Aortic stenosis- status post aortic valve replacement, myomectomy,  and coronary artery bypass grafting ( LIMA to LAD and SVG to PDA) 2. Coronary artery disease 3. Hypertension 4. Myasthenia gravis 5. Hypertension   History of Present Illness:  Stephen Mendoza is a 73 yo with hx of AS - s/p CABG, AVR, septak myomectomy.  He was diagnosed with myasthenia gravis and is now on Mestinon.  He recenly had IG antibodies which had helped quite a bit.  His blood pressure remains high. Since his diagnosis of myasthenia gravis we have tried to avoid beta blockers and calcium channel blockers. He's not had any episodes of chest pain or shortness of  Breath  February 01, 2012: Stephen Mendoza is doing very well from a cardiac standpoint. He's not had any episodes of chest pain or shortness breath.  He has developed some keloid formation at the lower aspect of his sternotomy.  July 25, 2012:  Stephen Mendoza is doing well.  He is here with his wife [name].  He just reached his 1 year anniversary of his heart surgery.  He has some irritation of his sternotomy scar.    Feb. 11, 2105:  Stephen Mendoza is doing well.  Has continued to eat a low fat diet and is losing weight. Still does not want to take a statin. For fasting lipids and lipomed profile today. No complaints.   Current Outpatient Prescriptions on File Prior to Visit  Medication Sig Dispense Refill  . B Complex Vitamins (VITAMIN-B COMPLEX PO) TAKE ONE TEASPOON DAILY      . CHELATED MANGANESE PO Take by mouth daily.      . Cholecalciferol (VITAMIN D-3) 1000 UNITS CAPS Take by mouth daily.      . COD LIVER OIL PO TAKE ONE TEASPOON DAILY      . Coenzyme Q10 (CO Q 10) 100 MG CAPS  Take 100 mg by mouth daily.       Marland Kitchen doxazosin (CARDURA) 2 MG tablet Take 1 tablet (2 mg total) by mouth at bedtime.  30 tablet  6  . losartan-hydrochlorothiazide (HYZAAR) 100-12.5 MG per tablet Take 1 tablet by mouth daily.      . potassium chloride (MICRO-K) 10 MEQ CR capsule TAKE 1 CAPSULE EVERY DAY  90 capsule  0   No current facility-administered medications on file prior to visit.    No Known Allergies  Past Medical History  Diagnosis Date  . Hypertension     Essential  . Aortic stenosis     severe  . Palpitations   . Aortic regurgitation   . Mitral regurgitation     Mild moderate  . Left atrial enlargement   . Polio osteopathy of lower leg   . Benign prostatic hypertrophy (BPH) with nocturia   . PONV (postoperative nausea and vomiting)   . Shortness of breath   . S/P aortic valve replacement with bioprosthetic valve 07/22/2011    21mm [facility] Ease pericardial tissue valve  . 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 Coronary artery disease   . GERD (gastroesophageal  reflux disease)   . Myasthenia gravis 09/21/2011    Past Surgical History  Procedure Laterality Date  . Cataract extraction    . Cardiac catheterization    . Coronary artery bypass graft  07/22/2011  . Aortic valve replacement  07/22/2011    History  Smoking status  . Former Smoker  . Types: Cigarettes  . Quit date: 10/24/1970  Smokeless tobacco  . Never Used    History  Alcohol Use No    Family History  Problem Relation Age of Onset  . Heart attack Father     Reviw of Systems:  Reviewed in the HPI.  All other systems are negative.  Physical Exam: Blood pressure 138/80, pulse 60, height 5\' 8"  (1.727 m), weight 157 lb 12.8 oz (71.578 kg). General: Well developed, well nourished, in no acute distress.  Head: Normocephalic, atraumatic, sclera non-icteric, mucus membranes are moist,   Neck: Supple. Carotids are 2 + without bruits. No JVD.   Lungs:  Clear bilaterally to auscultation.  Heart: regular rate.  normal  S1 S2. There is a 2/6 systolic murmur.  His sternotomy scar is healing well.  He has some keloid in the lower aspect of his wound.    Abdomen: Soft, non-tender, non-distended with normal bowel sounds. No hepatomegaly. No rebound/guarding. No masses.  Msk:  Strength and tone are normal  Extremities: No clubbing or cyanosis. No edema.  Distal pedal pulses are 1+ and equal bilaterally.  Neuro: Alert and oriented X 3. Moves all extremities spontaneously.  Psych:  Responds to questions appropriately with a normal affect.  ECG:  Assessment / Plan:

## 2013-02-23 LAB — NMR LIPOPROFILE WITH LIPIDS
Cholesterol, Total: 219 mg/dL — ABNORMAL HIGH (ref <200)
HDL PARTICLE NUMBER: 35.3 umol/L (ref >=30.5)
HDL Size: 10.2 nm (ref >=9.2)
HDL-C: 73 mg/dL (ref >=40)
LDL CALC: 137 mg/dL — AB (ref <100)
LDL PARTICLE NUMBER: 1293 nmol/L — AB (ref <1000)
LDL SIZE: 21.3 nm (ref >20.5)
Large HDL-P: 12.7 umol/L (ref >=4.8)
Large VLDL-P: 1.6 nmol/L (ref <=2.7)
SMALL LDL PARTICLE NUMBER: 294 nmol/L (ref <=527)
Triglycerides: 47 mg/dL (ref <150)
VLDL SIZE: 40.3 nm (ref <=46.6)

## 2013-03-02 NOTE — Progress Notes (Signed)
Quick Note:  Patient notified of test result from NMR Lipoprofile with Lipids and Dr. Harvie BridgeNahser's advisement to start on Atorvastatin and repeat labs in 3 mths. Patient stated that he has made good progress with diet and exercise, thus far. He is unwilling to go on a statin at this time. He states he already discussed this with Dr. Elease HashimotoNahser and he has not changed his mind. He also said he is scheduled to have labs in 6 mths and he will keep that appt and does not see the need to repeat them in 3 mths. He is open to discussion with Dr. Elease HashimotoNahser, if needed. ______

## 2013-04-04 ENCOUNTER — Other Ambulatory Visit: Payer: Self-pay | Admitting: Cardiovascular Disease

## 2013-05-01 ENCOUNTER — Other Ambulatory Visit: Payer: Self-pay | Admitting: Cardiovascular Disease

## 2013-07-04 ENCOUNTER — Other Ambulatory Visit: Payer: Self-pay | Admitting: *Deleted

## 2013-07-04 MED ORDER — DOXAZOSIN MESYLATE 2 MG PO TABS: ORAL_TABLET | ORAL | Status: DC

## 2013-07-29 ENCOUNTER — Other Ambulatory Visit: Payer: Self-pay | Admitting: Cardiovascular Disease

## 2013-08-11 ENCOUNTER — Telehealth: Payer: Self-pay | Admitting: Nurse Practitioner

## 2013-08-11 NOTE — Telephone Encounter (Signed)
Notified patient that letter received from [facility] regarding whether antibiotics are needed for colonoscopy scheduled for 8/20.  I advised patient that per AHA guidelines, antibiotic prophylaxis is not recommended for colonoscopy.  I attempted to call [facility] and the office was closed.  Will try to call again Monday.

## 2013-08-14 NOTE — Telephone Encounter (Signed)
Notified Inetta Fermoina with [facility] of AHA guidelines that antibiotics not required for colonoscopy in patient's with hx of heart valve replacement.  Inetta Fermoina verbalized understanding

## 2013-08-28 ENCOUNTER — Other Ambulatory Visit: Payer: 59

## 2013-08-28 ENCOUNTER — Encounter: Payer: Self-pay | Admitting: Cardiovascular Disease

## 2013-08-28 ENCOUNTER — Ambulatory Visit (INDEPENDENT_AMBULATORY_CARE_PROVIDER_SITE_OTHER): Payer: 59 | Admitting: Cardiovascular Disease

## 2013-08-28 VITALS — BP 137/77 | HR 58 | Ht 68.0 in | Wt 160.4 lb

## 2013-08-28 DIAGNOSIS — Z954 Presence of other heart-valve replacement: Secondary | ICD-10-CM

## 2013-08-28 DIAGNOSIS — E785 Hyperlipidemia, unspecified: Secondary | ICD-10-CM

## 2013-08-28 DIAGNOSIS — I2510 Atherosclerotic heart disease of native coronary artery without angina pectoris: Secondary | ICD-10-CM

## 2013-08-28 DIAGNOSIS — I25119 Atherosclerotic heart disease of native coronary artery with unspecified angina pectoris: Secondary | ICD-10-CM

## 2013-08-28 DIAGNOSIS — Z952 Presence of prosthetic heart valve: Secondary | ICD-10-CM

## 2013-08-28 DIAGNOSIS — I209 Angina pectoris, unspecified: Secondary | ICD-10-CM

## 2013-08-28 DIAGNOSIS — Z953 Presence of xenogenic heart valve: Secondary | ICD-10-CM

## 2013-08-28 LAB — BASIC METABOLIC PANEL
BUN: 10 mg/dL (ref 6–23)
CHLORIDE: 101 meq/L (ref 96–112)
CO2: 34 mEq/L — ABNORMAL HIGH (ref 19–32)
Calcium: 9.8 mg/dL (ref 8.4–10.5)
Creatinine, Ser: 0.7 mg/dL (ref 0.4–1.5)
GFR: 110.89 mL/min (ref >60.00)
GLUCOSE: 96 mg/dL (ref 70–99)
POTASSIUM: 3.6 meq/L (ref 3.5–5.1)
Sodium: 141 mEq/L (ref 135–145)

## 2013-08-28 LAB — HEPATIC FUNCTION PANEL
ALBUMIN: 4.2 g/dL (ref 3.5–5.2)
ALT: 24 U/L (ref 0–53)
AST: 23 U/L (ref 0–37)
Alkaline Phosphatase: 49 U/L (ref 39–117)
Bilirubin, Direct: 0 mg/dL (ref 0.0–0.3)
TOTAL PROTEIN: 6.9 g/dL (ref 6.0–8.3)
Total Bilirubin: 1.1 mg/dL (ref 0.2–1.2)

## 2013-08-28 NOTE — Progress Notes (Signed)
Stephen Mendoza Date of Birth  1940-08-27       [facility]    [place] [address]  [address] [place], [place]  [address]   [place], [place]  [address] [phone]     [phone]   Fax  [phone]    Fax [phone]  Problem List: 1. Aortic stenosis- status post aortic valve replacement (21mm [facility] Ease pericardial tissue valve), myomectomy,  and coronary artery bypass grafting ( LIMA to LAD and SVG to PDA) 2. Coronary artery disease 3. Hypertension 4. Myasthenia gravis 5. Hypertension   History of Present Illness:  Stephen Mendoza is a 73 yo with hx of AS - s/p CABG, AVR, septak myomectomy.  He was diagnosed with myasthenia gravis and is now on Mestinon.  He recenly had IG antibodies which had helped quite a bit.  His blood pressure remains high. Since his diagnosis of myasthenia gravis we have tried to avoid beta blockers and calcium channel blockers. He's not had any episodes of chest pain or shortness of  Breath  February 01, 2012: Stephen Mendoza is doing very well from a cardiac standpoint. He's not had any episodes of chest pain or shortness breath.  He has developed some keloid formation at the lower aspect of his sternotomy.  July 25, 2012:  Stephen Mendoza is doing well.  He is here with his wife [name].  He just reached his 1 year anniversary of his heart surgery.  He has some irritation of his sternotomy scar.    Feb. 11, 2105:  Stephen Mendoza is doing well.  Has continued to eat a low fat diet and is losing weight. Still does not want to take a statin. For fasting lipids and lipomed profile today. No complaints.  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   No issues with his myasthemia Gravis  Current Outpatient Prescriptions on File Prior to Visit  Medication Sig Dispense Refill  . B Complex Vitamins (VITAMIN-B COMPLEX PO) TAKE ONE TEASPOON DAILY      . CHELATED MANGANESE PO Take by mouth daily.      .  Cholecalciferol (VITAMIN D-3) 1000 UNITS CAPS Take by mouth daily.      Marland Kitchen doxazosin (CARDURA) 2 MG tablet TAKE 1 TABLET BY MOUTH AT BEDTIME  90 tablet  0  . losartan-hydrochlorothiazide (HYZAAR) 100-12.5 MG per tablet Take 1 tablet by mouth daily.      . potassium chloride (MICRO-K) 10 MEQ CR capsule TAKE 1 CAPSULE EVERY DAY  90 capsule  0   No current facility-administered medications on file prior to visit.    No Known Allergies  Past Medical History  Diagnosis Date  . Hypertension     Essential  . Aortic stenosis     severe  . Palpitations   . Aortic regurgitation   . Mitral regurgitation     Mild moderate  . Left atrial enlargement   . Polio osteopathy of lower leg   . Benign prostatic hypertrophy (BPH) with nocturia   . PONV (postoperative nausea and vomiting)   . Shortness of breath   . S/P aortic valve replacement with bioprosthetic valve 07/22/2011    21mm [facility] Ease pericardial tissue valve  . 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 Coronary artery disease   . GERD (gastroesophageal reflux disease)   . Myasthenia gravis 09/21/2011  Past Surgical History  Procedure Laterality Date  . Cataract extraction    . Cardiac catheterization    . Coronary artery bypass graft  07/22/2011  . Aortic valve replacement  07/22/2011    History  Smoking status  . Former Smoker  . Types: Cigarettes  . Quit date: 10/24/1970  Smokeless tobacco  . Never Used    History  Alcohol Use No    Family History  Problem Relation Age of Onset  . Heart attack Father     Reviw of Systems:  Reviewed in the HPI.  All other systems are negative.  Physical Exam: Blood pressure 137/77, pulse 58, height 5\' 8"  (1.727 m), weight 160 lb 6.4 oz (72.757 kg). General: Well developed, well nourished, in no acute distress.  Head: Normocephalic, atraumatic, sclera non-icteric, mucus membranes are moist,   Neck: Supple. Carotids are 2 + without  bruits. No JVD.   Lungs: Clear bilaterally to auscultation.  Heart: regular rate.  normal  S1 S2. There is a 2/6 systolic murmur. He has a very soft diastolic murmur.    His sternotomy scar is healing well.    Abdomen: Soft, non-tender, non-distended with normal bowel sounds. No hepatomegaly. No rebound/guarding. No masses.  Msk:  Strength and tone are normal  Extremities: No clubbing or cyanosis. No edema.  Distal pedal pulses are 1+ and equal bilaterally.  Neuro: Alert and oriented X 3. Moves all extremities spontaneously.  Psych:  Responds to questions appropriately with a normal affect.  ECG:  August 28, 2013:  Sinus brady at 8655. Assessment / Plan:

## 2013-08-28 NOTE — Assessment & Plan Note (Signed)
Is doing well status post coronary artery bypass grafting along with his aortic valve replacement. He refuses to take statin. Check a lipid profile today. Check liver enzymes as well.

## 2013-08-28 NOTE — Patient Instructions (Signed)
Your physician recommends that you continue on your current medications as directed. Please refer to the Current Medication list given to you today.  Your physician recommends that you go to the lab today for a FASTING lipid profile, Hepatic Panel, and BMET  Your physician has requested that you have an echocardiogram. Echocardiography is a painless test that uses sound waves to create images of your heart. It provides your doctor with information about the size and shape of your heart and how well your heart's chambers and valves are working. This procedure takes approximately one hour. There are no restrictions for this procedure.  Your physician wants you to follow-up in: 6 months with Dr Prescott ParmaNahser You will receive a reminder letter in the mail two months in advance. If you don't receive a letter, please call our office to schedule the follow-up appointment.

## 2013-08-28 NOTE — Assessment & Plan Note (Signed)
Sounds like he has a very soft diastolic murmur today. An echocardiogram to further evaluate his bioprostatic aortic valve.  He is asymptomatic and seems to be doing very well

## 2013-08-29 LAB — NMR LIPOPROFILE WITH LIPIDS
Cholesterol, Total: 252 mg/dL — ABNORMAL HIGH (ref <200)
HDL PARTICLE NUMBER: 35 umol/L (ref >=30.5)
HDL Size: 10 nm (ref >=9.2)
HDL-C: 73 mg/dL (ref >=40)
LDL (calc): 167 mg/dL — ABNORMAL HIGH (ref <100)
LDL PARTICLE NUMBER: 1574 nmol/L — AB (ref <1000)
LDL SIZE: 21.5 nm (ref >20.5)
LP-IR Score: <25 (ref <=45)
Large HDL-P: 13.4 umol/L (ref >=4.8)
Large VLDL-P: <0.8 nmol/L (ref <=2.7)
SMALL LDL PARTICLE NUMBER: 138 nmol/L (ref <=527)
Triglycerides: 62 mg/dL (ref <150)
VLDL Size: 37.8 nm (ref <=46.6)

## 2013-09-04 ENCOUNTER — Other Ambulatory Visit (HOSPITAL_COMMUNITY): Payer: Self-pay

## 2013-09-05 ENCOUNTER — Ambulatory Visit (HOSPITAL_COMMUNITY): Payer: PRIVATE HEALTH INSURANCE | Attending: Cardiology | Admitting: Cardiology

## 2013-09-05 ENCOUNTER — Ambulatory Visit (INDEPENDENT_AMBULATORY_CARE_PROVIDER_SITE_OTHER): Payer: 59 | Admitting: Pharmacist

## 2013-09-05 VITALS — Wt 160.0 lb

## 2013-09-05 DIAGNOSIS — I2510 Atherosclerotic heart disease of native coronary artery without angina pectoris: Secondary | ICD-10-CM | POA: Insufficient documentation

## 2013-09-05 DIAGNOSIS — Z954 Presence of other heart-valve replacement: Secondary | ICD-10-CM

## 2013-09-05 DIAGNOSIS — E785 Hyperlipidemia, unspecified: Secondary | ICD-10-CM

## 2013-09-05 DIAGNOSIS — Z952 Presence of prosthetic heart valve: Secondary | ICD-10-CM

## 2013-09-05 DIAGNOSIS — Z79899 Other long term (current) drug therapy: Secondary | ICD-10-CM

## 2013-09-05 MED ORDER — ROSUVASTATIN CALCIUM 10 MG PO TABS: 10.0000 mg | ORAL_TABLET | ORAL | Status: DC

## 2013-09-05 NOTE — Assessment & Plan Note (Addendum)
His LDL needs a 50% reduction if possible, which will likely be unlikely since he won't use a daily statin, and not interested in PCSK-9 inhibitors.  His LDL-P number and LDL-C are relatively concordant, therefore won't do NMR in future.  Patient and I had a long discussion about statin vs Zetia vs BAS.  Though he is not comfortable taking a daily statin, he is willing to take weekly Crestor, with understanding he will likely need daily Zetia in the future as well.  This combination can hopefully get him 40% LDL reduction at least.  Will give him some samples of Crestor 10 mg today and recheck panel in 3 months.  Since he lives in [place] he would like to be called with results.  Will likely need to add Zetia 10 mg qd to his weekly Crestor in 3 months. Plan: 1.  Get back to a plant based diet.  Reduce animal fat, eggs, fried foods. 2.  Add Crestor 10 mg once weekly - take on Sundays.   3.  If muscle aches occur, call Riki Rusk to let him know - [phone].  Would consider Zetia 10 mg once daily at that time. 4.  Recheck cholesterol and liver in 3 months on 11/28/13 - fasting labs, show up anytime after 7:30 am.

## 2013-09-05 NOTE — Progress Notes (Signed)
Echo performed. 

## 2013-09-05 NOTE — Progress Notes (Signed)
Patient is a 73 y.o. WM referred to lipid clinic by Dr. Elease Hashimoto given h/o CABG (2013), elevated LDL, and refusal of taking a statin in the past.  Patient is fearful of taking statins based on what he has read and seen in media, and is skeptical of medication in general.  He does ask about Zetia today as he has read about this.  Patient tried low dose simvastatin in 07/2011, however stopped this on his own.  He doesn't recall if he had side effects or if he stopped out of fear of side effects.  Patient not interested in discussing PCSK-9 inhibitors.  Patient has not tried other statins (other than simvastatin) in the past.  He did take niacin at one point he tells me.  His LDL has ranged from 135 to 195 mg/dL off lipid lowering medication, and seems to fluctuate significantly based on his dietary pattern.  He was previously eating a solely plant based diet and got LDL to 137 mg/dL, but over past 6 months has increased his meat and eggs in diet, and LDL back up to 167 mg/dL.  Patient denies alcohol use, and is a former smoker.  RF:  CAD, HTN, age - LDL goal < 70, non-HDL goal < 100 Meds:  Fish oil Tried in the past:  Simvastatin (stopped on his own for fear of side effect); Niacin (tolerated but stopped due to lack of lipid lowering)  Labs:   08/2013:  LDL-P number 1574, LDL 167, TC 252, TG 62, HDL 73 (fish oil)  Current Outpatient Prescriptions  Medication Sig Dispense Refill  . aspirin 81 MG tablet Take 81 mg by mouth daily.      . B Complex Vitamins (VITAMIN-B COMPLEX PO) TAKE ONE TEASPOON DAILY      . CHELATED MANGANESE PO Take by mouth daily.      . Cholecalciferol (VITAMIN D-3) 1000 UNITS CAPS Take by mouth daily.      . Cyanocobalamin (VITAMIN B 12 PO) Take 500 mcg by mouth.      . doxazosin (CARDURA) 2 MG tablet TAKE 1 TABLET BY MOUTH AT BEDTIME  90 tablet  0  . Flaxseed, Linseed, (FLAX SEED OIL) 1000 MG CAPS Take by mouth daily.      Marland Kitchen losartan-hydrochlorothiazide (HYZAAR) 100-12.5 MG per  tablet Take 1 tablet by mouth daily.      . Omega-3 Fatty Acids (FISH OIL) 1000 MG CAPS Take by mouth daily.      Marland Kitchen OVER THE COUNTER MEDICATION Chromium Picolinate 1000 MCG      . potassium chloride (MICRO-K) 10 MEQ CR capsule TAKE 1 CAPSULE EVERY DAY  90 capsule  0   No current facility-administered medications for this visit.   No Known Allergies  Family History  Problem Relation Age of Onset  . Heart attack Father

## 2013-09-05 NOTE — Patient Instructions (Signed)
1.  Get back to a plant based diet.  Reduce animal fat, eggs, fried foods. 2.  Add Crestor 10 mg once weekly - take on Sundays.   3.  If muscle aches occur, call Riki Rusk to let him know - [phone].  Would consider Zetia 10 mg once daily at that time. 4.  Recheck cholesterol and liver in 3 months on 11/28/13 - fasting labs, show up anytime after 7:30 am.

## 2013-09-23 ENCOUNTER — Other Ambulatory Visit: Payer: Self-pay | Admitting: Cardiovascular Disease

## 2013-11-04 ENCOUNTER — Other Ambulatory Visit: Payer: Self-pay | Admitting: Cardiovascular Disease

## 2013-11-07 ENCOUNTER — Other Ambulatory Visit: Payer: Self-pay | Admitting: Cardiovascular Disease

## 2013-11-28 ENCOUNTER — Other Ambulatory Visit: Payer: Self-pay

## 2013-12-04 ENCOUNTER — Other Ambulatory Visit (INDEPENDENT_AMBULATORY_CARE_PROVIDER_SITE_OTHER): Payer: 59 | Admitting: *Deleted

## 2013-12-04 DIAGNOSIS — Z79899 Other long term (current) drug therapy: Secondary | ICD-10-CM

## 2013-12-04 DIAGNOSIS — E785 Hyperlipidemia, unspecified: Secondary | ICD-10-CM

## 2013-12-04 LAB — LIPID PANEL
CHOL/HDL RATIO: 4
Cholesterol: 240 mg/dL — ABNORMAL HIGH (ref 0–200)
HDL: 64.3 mg/dL (ref >39.00)
LDL CALC: 168 mg/dL — AB (ref 0–99)
NONHDL: 175.7
Triglycerides: 39 mg/dL (ref 0.0–149.0)
VLDL: 7.8 mg/dL (ref 0.0–40.0)

## 2013-12-04 LAB — HEPATIC FUNCTION PANEL
ALT: 21 U/L (ref 0–53)
AST: 26 U/L (ref 0–37)
Albumin: 4.3 g/dL (ref 3.5–5.2)
Alkaline Phosphatase: 52 U/L (ref 39–117)
BILIRUBIN DIRECT: 0 mg/dL (ref 0.0–0.3)
Total Bilirubin: 0.8 mg/dL (ref 0.2–1.2)
Total Protein: 6.7 g/dL (ref 6.0–8.3)

## 2014-02-06 ENCOUNTER — Other Ambulatory Visit: Payer: Self-pay | Admitting: Cardiovascular Disease

## 2014-02-23 ENCOUNTER — Telehealth: Payer: Self-pay | Admitting: Cardiovascular Disease

## 2014-02-27 NOTE — Progress Notes (Signed)
Cardiology Office Note   Date:  02/27/2014   ID:  Stephen Mendoza, DOB 07/29/1940, MRN [id number]  PCP:  Judge StallBAKER,CLIFTON, MD  Cardiologist:   Vesta MixerNahser, Sujata Maines J, MD   Chief Complaint  Patient presents with  . Follow-up    aortic valve replacment   Problem List: 1. Aortic stenosis- status post aortic valve replacement (21mm [facility] Ease pericardial tissue valve), myomectomy, and coronary artery bypass grafting ( LIMA to LAD and SVG to PDA) 2. Coronary artery disease 3. Hypertension 4. Myasthenia gravis 5. Hypertension   History of Present Illness:  Stephen Mendoza is a 74 yo with hx of AS - s/p CABG, AVR, septak myomectomy.  He was diagnosed with myasthenia gravis and is now on Mestinon. He recenly had IG antibodies which had helped quite a bit.  His blood pressure remains high. Since his diagnosis of myasthenia gravis we have tried to avoid beta blockers and calcium channel blockers. He's not had any episodes of chest pain or shortness of Breath  February 01, 2012: Stephen Mendoza is doing very well from a cardiac standpoint. He's not had any episodes of chest pain or shortness breath. He has developed some keloid formation at the lower aspect of his sternotomy.  July 25, 2012:  Stephen Mendoza is doing well. He is here with his wife Stephen Mendoza. He just reached his 1 year anniversary of his heart surgery. He has some irritation of his sternotomy scar.   Feb. 11, 2105:  Stephen Mendoza is doing well. Has continued to eat a low fat diet and is losing weight. Still does not want to take a statin. For fasting lipids and lipomed profile today. No complaints.  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  No issues with his myasthemia Gravis   Feb. 16, 2016  Stephen Mendoza is a 74 y.o. male who presents for follow up of his AVR, myomectomy.  He had an echo following his last office visit and his valve looks great.  He has trace AI.  He is concerned with his mildly elevated  BP.   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   Past Medical History  Diagnosis Date  . Hypertension     Essential  . Aortic stenosis     severe  . Palpitations   . Aortic regurgitation   . Mitral regurgitation     Mild moderate  . Left atrial enlargement   . Polio osteopathy of lower leg   . Benign prostatic hypertrophy (BPH) with nocturia   . PONV (postoperative nausea and vomiting)   . Shortness of breath   . S/P aortic valve replacement with bioprosthetic valve 07/22/2011    [facility]mm [facility] Ease pericardial tissue valve  . 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 Coronary artery disease   . GERD (gastroesophageal reflux disease)   . Myasthenia gravis 09/21/2011    Past Surgical History  Procedure Laterality Date  . Cataract extraction    . Cardiac catheterization    . Coronary artery bypass graft  07/22/2011  . Aortic valve replacement  07/22/2011     Current Outpatient Prescriptions  Medication Sig Dispense Refill  . aspirin 81 MG tablet Take 81 mg by mouth daily.    . B Complex Vitamins (VITAMIN-B COMPLEX PO) TAKE ONE TEASPOON DAILY    . CHELATED MANGANESE PO Take by mouth daily.    .Marland Kitchen  Cholecalciferol (VITAMIN D-3) 1000 UNITS CAPS Take by mouth daily.    . Cyanocobalamin (VITAMIN B 12 PO) Take 500 mcg by mouth.    . doxazosin (CARDURA) 2 MG tablet Take 1 tablet by mouth at  bedtime 90 tablet 0  . Flaxseed, Linseed, (FLAX SEED OIL) 1000 MG CAPS Take by mouth daily.    Marland Kitchen losartan-hydrochlorothiazide (HYZAAR) 100-12.5 MG per tablet Take 1 tablet by mouth daily.    . Omega-3 Fatty Acids (FISH OIL) 1000 MG CAPS Take by mouth daily.    Marland Kitchen OVER THE COUNTER MEDICATION Chromium Picolinate 1000 MCG    . potassium chloride (MICRO-K) 10 MEQ CR capsule TAKE 1 CAPSULE EVERY DAY 90 capsule 1  . rosuvastatin (CRESTOR) 10 MG tablet Take 1 tablet (10 mg total) by mouth once a week. 30 tablet 5   No current facility-administered  medications for this visit.    Allergies:   Review of patient's allergies indicates no known allergies.    Social History:  The patient  reports that he quit smoking about 43 years ago. His smoking use included Cigarettes. He has never used smokeless tobacco. He reports that he does not drink alcohol or use illicit drugs.   Family History:  The patient's family history includes Heart attack in his father.    ROS:  Please see the history of present illness.    Review of Systems: Constitutional:  denies fever, chills, diaphoresis, appetite change and fatigue.  HEENT: denies photophobia, eye pain, redness, hearing loss, ear pain, congestion, sore throat, rhinorrhea, sneezing, neck pain, neck stiffness and tinnitus.  Respiratory: denies SOB, DOE, cough, chest tightness, and wheezing.  Cardiovascular: denies chest pain, palpitations and leg swelling.  Gastrointestinal: denies nausea, vomiting, abdominal pain, diarrhea, constipation, blood in stool.  Genitourinary: denies dysuria, urgency, frequency, hematuria, flank pain and difficulty urinating.  Musculoskeletal: denies  myalgias, back pain, joint swelling, arthralgias and gait problem.   Skin: denies pallor, rash and wound.  Neurological: denies dizziness, seizures, syncope, weakness, light-headedness, numbness and headaches.   Hematological: denies adenopathy, easy bruising, personal or family bleeding history.  Psychiatric/ Behavioral: denies suicidal ideation, mood changes, confusion, nervousness, sleep disturbance and agitation.       All other systems are reviewed and negative.    PHYSICAL EXAM: VS:  There were no vitals taken for this visit. , BMI There is no weight on file to calculate BMI. GEN: Well nourished, well developed, in no acute distress HEENT: normal Neck: no JVD, carotid bruits, or masses Cardiac: RRR; 2/6 systlic murmur , rubs, or gallops,no edema  Respiratory:  clear to auscultation bilaterally, normal work of  breathing GI: soft, nontender, nondistended, + BS MS: no deformity or atrophy Skin: warm and dry, no rash Neuro:  Strength and sensation are intact Psych: normal   EKG:  EKG is not ordered today.    Recent Labs: 08/28/2013: BUN 10; Creatinine 0.7; Potassium 3.6; Sodium 141 12/04/2013: ALT 21    Lipid Panel    Component Value Date/Time   CHOL 240* 12/04/2013 0935   TRIG 39.0 12/04/2013 0935   TRIG 62 08/28/2013 1008   HDL 64.30 12/04/2013 0935   CHOLHDL 4 12/04/2013 0935   VLDL 7.8 12/04/2013 0935   LDLCALC 168* 12/04/2013 0935   LDLCALC 167* 08/28/2013 1008   LDLDIRECT 140.3 09/15/2011 1149      Wt Readings from Last 3 Encounters:  09/05/13 160 lb (72.576 kg)  08/28/13 160 lb 6.4 oz (72.757 kg)  02/22/13 157 lb 12.8 oz (  71.578 kg)      Other studies Reviewed: Additional studies/ records that were reviewed today include: . Review of the above records demonstrates:    ASSESSMENT AND PLAN:  1. Aortic stenosis- status post aortic valve replacement (21mm [facility] Ease pericardial tissue valve), myomectomy, and coronary artery bypass grafting ( LIMA to LAD and SVG to PDA) 2. Coronary artery disease 3. Hypertension - hi sBP is a bit elevated.  Will DC the losartan HCT and start valsarta 320 . HCTZ 25 a day.  Check BMP in 1 month  4. Myasthenia gravis   Current medicines are reviewed at length with the patient today.  The patient does not have concerns regarding medicines.  The following changes have been made:  Start Valsartan HCT    Disposition:   FU with me in 3 months     Signed, Sabrinna Yearwood, Deloris Ping, MD  02/27/2014 3:12 PM    [facility] [address] [place], [place], [place]  [address] Phone: [phone]; Fax: [phone]

## 2014-02-28 ENCOUNTER — Encounter: Payer: Self-pay | Admitting: Cardiovascular Disease

## 2014-02-28 ENCOUNTER — Ambulatory Visit (INDEPENDENT_AMBULATORY_CARE_PROVIDER_SITE_OTHER): Payer: 59 | Admitting: Cardiovascular Disease

## 2014-02-28 ENCOUNTER — Other Ambulatory Visit: Payer: Self-pay

## 2014-02-28 ENCOUNTER — Other Ambulatory Visit (INDEPENDENT_AMBULATORY_CARE_PROVIDER_SITE_OTHER): Payer: Medicare Other | Admitting: *Deleted

## 2014-02-28 VITALS — BP 176/84 | HR 64 | Ht 68.0 in | Wt 158.8 lb

## 2014-02-28 DIAGNOSIS — Z952 Presence of prosthetic heart valve: Secondary | ICD-10-CM

## 2014-02-28 DIAGNOSIS — Z954 Presence of other heart-valve replacement: Secondary | ICD-10-CM

## 2014-02-28 DIAGNOSIS — I2510 Atherosclerotic heart disease of native coronary artery without angina pectoris: Secondary | ICD-10-CM

## 2014-02-28 DIAGNOSIS — E785 Hyperlipidemia, unspecified: Secondary | ICD-10-CM

## 2014-02-28 DIAGNOSIS — I10 Essential (primary) hypertension: Secondary | ICD-10-CM

## 2014-02-28 DIAGNOSIS — Z953 Presence of xenogenic heart valve: Secondary | ICD-10-CM

## 2014-02-28 DIAGNOSIS — G7000 Myasthenia gravis without (acute) exacerbation: Secondary | ICD-10-CM

## 2014-02-28 LAB — CBC WITH DIFFERENTIAL/PLATELET
BASOS ABS: 0 10*3/uL (ref 0.0–0.1)
Basophils Relative: 0.9 % (ref 0.0–3.0)
EOS ABS: 0.2 10*3/uL (ref 0.0–0.7)
EOS PCT: 4 % (ref 0.0–5.0)
HCT: 41 % (ref 39.0–52.0)
Hemoglobin: 13.8 g/dL (ref 13.0–17.0)
Lymphocytes Relative: 34.2 % (ref 12.0–46.0)
Lymphs Abs: 1.5 10*3/uL (ref 0.7–4.0)
MCHC: 33.5 g/dL (ref 30.0–36.0)
MCV: 92.7 fl (ref 78.0–100.0)
MONO ABS: 0.3 10*3/uL (ref 0.1–1.0)
Monocytes Relative: 8.1 % (ref 3.0–12.0)
NEUTROS PCT: 52.8 % (ref 43.0–77.0)
Neutro Abs: 2.2 10*3/uL (ref 1.4–7.7)
Platelets: 157 10*3/uL (ref 150.0–400.0)
RBC: 4.43 Mil/uL (ref 4.22–5.81)
RDW: 14.4 % (ref 11.5–15.5)
WBC: 4.3 10*3/uL (ref 4.0–10.5)

## 2014-02-28 LAB — HEPATIC FUNCTION PANEL
ALT: 25 U/L (ref 0–53)
AST: 26 U/L (ref 0–37)
Albumin: 4.2 g/dL (ref 3.5–5.2)
Alkaline Phosphatase: 47 U/L (ref 39–117)
Bilirubin, Direct: 0.2 mg/dL (ref 0.0–0.3)
Total Bilirubin: 0.7 mg/dL (ref 0.2–1.2)
Total Protein: 6.8 g/dL (ref 6.0–8.3)

## 2014-02-28 LAB — BASIC METABOLIC PANEL
BUN: 11 mg/dL (ref 6–23)
CALCIUM: 9.5 mg/dL (ref 8.4–10.5)
CO2: 31 meq/L (ref 19–32)
Chloride: 103 mEq/L (ref 96–112)
Creatinine, Ser: 0.72 mg/dL (ref 0.40–1.50)
GFR: 114.29 mL/min (ref >60.00)
GLUCOSE: 105 mg/dL — AB (ref 70–99)
Potassium: 3.9 mEq/L (ref 3.5–5.1)
SODIUM: 140 meq/L (ref 135–145)

## 2014-02-28 MED ORDER — VALSARTAN-HYDROCHLOROTHIAZIDE 320-25 MG PO TABS: 1.0000 | ORAL_TABLET | Freq: Every day | ORAL | Status: DC

## 2014-02-28 NOTE — Addendum Note (Signed)
Addended by: Tonita PhoenixBOWDEN, ROBIN K on: 02/28/2014 08:58 AM   Modules accepted: Orders

## 2014-02-28 NOTE — Patient Instructions (Signed)
Your physician has recommended you make the following change in your medication:  STOP Losartan/HCTZ START Valsartan 320/25 mg  Your physician recommends that you return for lab work in: 1 month - basic metabolic panel  Your physician recommends that you schedule a follow-up appointment in: 3 months with Dr. Elease HashimotoNahser

## 2014-03-02 LAB — NMR LIPOPROFILE WITH LIPIDS
Cholesterol, Total: 231 mg/dL — ABNORMAL HIGH (ref 100–199)
HDL Particle Number: 33.6 umol/L (ref >=30.5)
HDL SIZE: 10.2 nm (ref >=9.2)
HDL-C: 84 mg/dL (ref >39)
LARGE HDL: 15 umol/L (ref >=4.8)
LARGE VLDL-P: 1.1 nmol/L (ref <=2.7)
LDL (calc): 137 mg/dL — ABNORMAL HIGH (ref 0–99)
LDL PARTICLE NUMBER: 1154 nmol/L — AB (ref <1000)
LDL Size: 21.3 nm (ref >=20.8)
LP-IR Score: <25 (ref <=45)
Small LDL Particle Number: <90 nmol/L (ref <=527)
Triglycerides: 49 mg/dL (ref 0–149)
VLDL SIZE: 49.2 nm — AB (ref <=46.6)

## 2014-03-20 ENCOUNTER — Telehealth: Payer: Self-pay | Admitting: Nurse Practitioner

## 2014-03-20 MED ORDER — NIACIN ER 500 MG PO CPCR: 500.0000 mg | ORAL_CAPSULE | Freq: Every day | ORAL | Status: DC

## 2014-03-20 MED ORDER — RED YEAST RICE EXTRACT 600 MG PO CAPS
600.0000 mg | ORAL_CAPSULE | Freq: Two times a day (BID) | ORAL | Status: DC
Start: 2014-03-20 — End: 2015-12-10

## 2014-03-20 NOTE — Telephone Encounter (Signed)
Called and spoke with patient to find out how his blood pressure is doing from complaint on 2/24 that new dose of Diovan HCT might be too strong. Patient had decreased medication to 1/2 tab until this past Sunday, 3/6.  Patient states on Sunday he resumed a full tablet and his blood pressure is great.  Patient reports BP yesterday at 1 pm 123/62; at 6 pm 136/70 and at bedtime 129/64.  Patient denies light-headedness, dizziness, or feeling poor; patient states he feels great and thinks he just needed some time to adjust to the medication. I advised patient to call back with questions or concerns and verified that patient is aware of follow-up lab and office visit appointments.  Patient verbalized understanding and agreement.

## 2014-03-29 ENCOUNTER — Other Ambulatory Visit (INDEPENDENT_AMBULATORY_CARE_PROVIDER_SITE_OTHER): Payer: Medicare Other | Admitting: *Deleted

## 2014-03-29 DIAGNOSIS — Z954 Presence of other heart-valve replacement: Secondary | ICD-10-CM

## 2014-03-29 DIAGNOSIS — E785 Hyperlipidemia, unspecified: Secondary | ICD-10-CM | POA: Diagnosis not present

## 2014-03-29 DIAGNOSIS — Z952 Presence of prosthetic heart valve: Secondary | ICD-10-CM

## 2014-03-29 LAB — BASIC METABOLIC PANEL
BUN: 11 mg/dL (ref 6–23)
CALCIUM: 9.3 mg/dL (ref 8.4–10.5)
CO2: 35 meq/L — AB (ref 19–32)
CREATININE: 0.73 mg/dL (ref 0.40–1.50)
Chloride: 101 mEq/L (ref 96–112)
GFR: 112.46 mL/min (ref >60.00)
GLUCOSE: 107 mg/dL — AB (ref 70–99)
Potassium: 3.6 mEq/L (ref 3.5–5.1)
SODIUM: 139 meq/L (ref 135–145)

## 2014-03-29 NOTE — Addendum Note (Signed)
Addended by: Tonita PhoenixBOWDEN, Natina Wiginton K on: 03/29/2014 08:50 AM   Modules accepted: Orders

## 2014-04-12 NOTE — Telephone Encounter (Signed)
error 

## 2014-05-07 ENCOUNTER — Other Ambulatory Visit: Payer: Self-pay | Admitting: Cardiovascular Disease

## 2014-06-01 ENCOUNTER — Encounter: Payer: Self-pay | Admitting: Cardiovascular Disease

## 2014-06-01 ENCOUNTER — Ambulatory Visit (INDEPENDENT_AMBULATORY_CARE_PROVIDER_SITE_OTHER): Payer: Medicare Other | Admitting: Cardiovascular Disease

## 2014-06-01 VITALS — BP 140/80 | HR 49 | Ht 68.0 in | Wt 158.8 lb

## 2014-06-01 DIAGNOSIS — I350 Nonrheumatic aortic (valve) stenosis: Secondary | ICD-10-CM

## 2014-06-01 DIAGNOSIS — E785 Hyperlipidemia, unspecified: Secondary | ICD-10-CM | POA: Diagnosis not present

## 2014-06-01 DIAGNOSIS — Z954 Presence of other heart-valve replacement: Secondary | ICD-10-CM

## 2014-06-01 DIAGNOSIS — Z952 Presence of prosthetic heart valve: Secondary | ICD-10-CM

## 2014-06-01 DIAGNOSIS — I2510 Atherosclerotic heart disease of native coronary artery without angina pectoris: Secondary | ICD-10-CM

## 2014-06-01 LAB — BASIC METABOLIC PANEL
BUN: 11 mg/dL (ref 6–23)
CHLORIDE: 101 meq/L (ref 96–112)
CO2: 35 meq/L — AB (ref 19–32)
Calcium: 9.9 mg/dL (ref 8.4–10.5)
Creatinine, Ser: 0.71 mg/dL (ref 0.40–1.50)
GFR: 116.06 mL/min (ref >60.00)
GLUCOSE: 119 mg/dL — AB (ref 70–99)
Potassium: 3.7 mEq/L (ref 3.5–5.1)
Sodium: 139 mEq/L (ref 135–145)

## 2014-06-01 LAB — LIPID PANEL
CHOLESTEROL: 234 mg/dL — AB (ref 0–200)
HDL: 93.2 mg/dL (ref >39.00)
LDL Cholesterol: 132 mg/dL — ABNORMAL HIGH (ref 0–99)
NonHDL: 140.8
TRIGLYCERIDES: 44 mg/dL (ref 0.0–149.0)
Total CHOL/HDL Ratio: 3
VLDL: 8.8 mg/dL (ref 0.0–40.0)

## 2014-06-01 LAB — HEPATIC FUNCTION PANEL
ALT: 27 U/L (ref 0–53)
AST: 30 U/L (ref 0–37)
Albumin: 4.5 g/dL (ref 3.5–5.2)
Alkaline Phosphatase: 56 U/L (ref 39–117)
BILIRUBIN TOTAL: 0.6 mg/dL (ref 0.2–1.2)
Bilirubin, Direct: 0.1 mg/dL (ref 0.0–0.3)
Total Protein: 7.2 g/dL (ref 6.0–8.3)

## 2014-06-01 NOTE — Patient Instructions (Addendum)
Medication Instructions:  Your physician recommends that you continue on your current medications as directed. Please refer to the Current Medication list given to you today.   Labwork: TODAY - cholesterol, liver, bmet  Your physician recommends that you return for lab work in: 1 year on the day of or a few days before your office visit with Dr. Elease HashimotoNahser.  You will need to FAST for this appointment - nothing to eat or drink after midnight the night before except water.   Testing/Procedures: None Ordered  Follow-Up: Your physician wants you to follow-up in: 1 year with Dr. Elease HashimotoNahser.  You will receive a reminder letter in the mail two months in advance. If you don't receive a letter, please call our office to schedule the follow-up appointment.

## 2014-06-01 NOTE — Progress Notes (Signed)
Cardiology Office Note   Date:  06/01/2014   ID:  Stephen Mendoza, Stephen Mendoza 06-Aug-1940, MRN [id number]  PCP:  Judge Stall, MD  Cardiologist:   [name], MD   Chief Complaint  Patient presents with  . Follow-up    aortic valve replacement    Problem List: 1. Aortic stenosis- status post aortic valve replacement (21mm [facility] Ease pericardial tissue valve), myomectomy, and coronary artery bypass grafting ( LIMA to LAD and SVG to PDA) 2. Coronary artery disease 3. Hypertension 4. Myasthenia gravis 5. Hypertension   History of Present Illness:  Stephen Mendoza is a 64 yo with hx of AS - s/p CABG, AVR, septak myomectomy.  He was diagnosed with myasthenia gravis and is now on Mestinon. He recenly had IG antibodies which had helped quite a bit.  His blood pressure remains high. Since his diagnosis of myasthenia gravis we have tried to avoid beta blockers and calcium channel blockers. He's not had any episodes of chest pain or shortness of Breath  February 01, 2012: Stephen Mendoza is doing very well from a cardiac standpoint. He's not had any episodes of chest pain or shortness breath. He has developed some keloid formation at the lower aspect of his sternotomy.  July 25, 2012:  Stephen Mendoza is doing well. He is here with his wife [name]. He just reached his 1 year anniversary of his heart surgery. He has some irritation of his sternotomy scar.   Feb. 11, 2105:  Stephen Mendoza is doing well. Has continued to eat a low fat diet and is losing weight. Still does not want to take a statin. For fasting lipids and lipomed profile today. No complaints.  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  No issues with his myasthemia Gravis   Feb. 16, 2016  Stephen Mendoza is a 74 y.o. male who presents for follow up of his AVR, myomectomy.  He had an echo following his last office visit and his valve looks great.  He has trace AI.  He is concerned with his mildly  elevated BP.   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   Jun 01, 2014: Stephen Mendoza is doing well.  BP is generally better on the Valsartan.  No CP, breathing active.    Past Medical History  Diagnosis Date  . Hypertension     Essential  . Aortic stenosis     severe  . Palpitations   . Aortic regurgitation   . Mitral regurgitation     Mild moderate  . Left atrial enlargement   . Polio osteopathy of lower leg   . Benign prostatic hypertrophy (BPH) with nocturia   . PONV (postoperative nausea and vomiting)   . Shortness of breath   . S/P aortic valve replacement with bioprosthetic valve 07/22/2011    21mm [facility] Ease pericardial tissue valve  . 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 Coronary artery disease   . GERD (gastroesophageal reflux disease)   . Myasthenia gravis 09/21/2011    Past Surgical History  Procedure Laterality Date  . Cataract extraction    . Cardiac catheterization    . Coronary artery bypass graft  07/22/2011  . Aortic valve replacement  07/22/2011     Current Outpatient Prescriptions  Medication Sig Dispense Refill  . aspirin 81 MG tablet Take 81 mg by mouth daily.    Marland Kitchen  B Complex Vitamins (VITAMIN-B COMPLEX PO) TAKE ONE TEASPOON DAILY    . CHELATED MANGANESE PO Take by mouth daily.    . Cholecalciferol (VITAMIN D-3) 1000 UNITS CAPS Take by mouth daily.    . Cyanocobalamin (VITAMIN B 12 PO) Take 500 mcg by mouth.    . doxazosin (CARDURA) 2 MG tablet Take 1 tablet by mouth at  bedtime 90 tablet 0  . niacin 500 MG CR capsule Take 1 capsule (500 mg total) by mouth at bedtime.    Marland Kitchen. OVER THE COUNTER MEDICATION Take by mouth daily. ASHWAGANDHA 670MG     . potassium chloride (MICRO-K) 10 MEQ CR capsule TAKE 1 CAPSULE EVERY DAY 90 capsule 3  . Red Yeast Rice Extract 600 MG CAPS Take 1 capsule (600 mg total) by mouth 2 (two) times daily.  0  . valsartan-hydrochlorothiazide (DIOVAN-HCT) 320-25 MG per tablet  Take 1 tablet by mouth daily. 31 tablet 11   No current facility-administered medications for this visit.    Allergies:   Review of patient's allergies indicates no known allergies.    Social History:  The patient  reports that he quit smoking about 43 years ago. His smoking use included Cigarettes. He has never used smokeless tobacco. He reports that he does not drink alcohol or use illicit drugs.   Family History:  The patient's family history includes Heart attack in his father.    ROS:  Please see the history of present illness.    Review of Systems: Constitutional:  denies fever, chills, diaphoresis, appetite change and fatigue.  HEENT: denies photophobia, eye pain, redness, hearing loss, ear pain, congestion, sore throat, rhinorrhea, sneezing, neck pain, neck stiffness and tinnitus.  Respiratory: denies SOB, DOE, cough, chest tightness, and wheezing.  Cardiovascular: denies chest pain, palpitations and leg swelling.  Gastrointestinal: denies nausea, vomiting, abdominal pain, diarrhea, constipation, blood in stool.  Genitourinary: denies dysuria, urgency, frequency, hematuria, flank pain and difficulty urinating.  Musculoskeletal: denies  myalgias, back pain, joint swelling, arthralgias and gait problem.   Skin: denies pallor, rash and wound.  Neurological: denies dizziness, seizures, syncope, weakness, light-headedness, numbness and headaches.   Hematological: denies adenopathy, easy bruising, personal or family bleeding history.  Psychiatric/ Behavioral: denies suicidal ideation, mood changes, confusion, nervousness, sleep disturbance and agitation.       All other systems are reviewed and negative.    PHYSICAL EXAM: VS:  BP 140/80 mmHg  Pulse 49  Ht 5\' 8"  (1.727 m)  Wt 72.031 kg (158 lb 12.8 oz)  BMI 24.15 kg/m2  SpO2 99% , BMI Body mass index is 24.15 kg/(m^2). GEN: Well nourished, well developed, in no acute distress HEENT: normal Neck: no JVD, carotid bruits, or  masses Cardiac: RRR; 2/6 systlic murmur , rubs, or gallops,no edema  Respiratory:  clear to auscultation bilaterally, normal work of breathing GI: soft, nontender, nondistended, + BS MS: no deformity or atrophy Skin: warm and dry, no rash Neuro:  Strength and sensation are intact Psych: normal   EKG:  EKG is not ordered today.    Recent Labs: 02/28/2014: ALT 25; Hemoglobin 13.8; Platelets 157.0 03/29/2014: BUN 11; Creatinine 0.73; Potassium 3.6; Sodium 139    Lipid Panel    Component Value Date/Time   CHOL 231* 02/28/2014 0858   CHOL 240* 12/04/2013 0935   TRIG 49 02/28/2014 0858   TRIG 39.0 12/04/2013 0935   HDL 84 02/28/2014 0858   HDL 64.30 12/04/2013 0935   CHOLHDL 4 12/04/2013 0935   VLDL 7.8 12/04/2013 0935  LDLCALC 137* 02/28/2014 0858   LDLCALC 168* 12/04/2013 0935   LDLDIRECT 140.3 09/15/2011 1149      Wt Readings from Last 3 Encounters:  06/01/14 72.031 kg (158 lb 12.8 oz)  02/28/14 72.031 kg (158 lb 12.8 oz)  09/05/13 72.576 kg (160 lb)      Other studies Reviewed: Additional studies/ records that were reviewed today include: . Review of the above records demonstrates:    ASSESSMENT AND PLAN:  1. Aortic stenosis- status post aortic valve replacement ([facility]mm [facility] Ease pericardial tissue valve), myomectomy, and coronary artery bypass grafting ( LIMA to LAD and SVG to PDA) 2. Coronary artery disease - he is stable , refuses to take a statin  3. Hypertension - BP is better controlled , continue meds  4. Myasthenia gravis 5. Hyperlipidemia:  Refuses to take statin.  Is on niacin and red yeast rice Will check levels to day  Will see him in 1 year    Current medicines are reviewed at length with the patient today.  The patient does not have concerns regarding medicines.  The following changes have been made:     Disposition:   FU with me in 1 year     Signed, Amaru Burroughs, Deloris PingPhilip J, MD  06/01/2014 9:04 AM    [facility] [address], [place], [place]  [address] Phone: [phone]; Fax: [phone]

## 2014-12-03 ENCOUNTER — Other Ambulatory Visit (INDEPENDENT_AMBULATORY_CARE_PROVIDER_SITE_OTHER): Payer: Medicare Other | Admitting: *Deleted

## 2014-12-03 DIAGNOSIS — I2510 Atherosclerotic heart disease of native coronary artery without angina pectoris: Secondary | ICD-10-CM

## 2014-12-03 DIAGNOSIS — I350 Nonrheumatic aortic (valve) stenosis: Secondary | ICD-10-CM | POA: Diagnosis not present

## 2014-12-03 DIAGNOSIS — E785 Hyperlipidemia, unspecified: Secondary | ICD-10-CM | POA: Diagnosis not present

## 2014-12-03 DIAGNOSIS — I10 Essential (primary) hypertension: Secondary | ICD-10-CM

## 2014-12-03 LAB — LIPID PANEL
CHOLESTEROL: 208 mg/dL — AB (ref 125–200)
HDL: 105 mg/dL (ref >=40)
LDL CALC: 95 mg/dL (ref <130)
TRIGLYCERIDES: 42 mg/dL (ref <150)
Total CHOL/HDL Ratio: 2 Ratio (ref <=5.0)
VLDL: 8 mg/dL (ref <30)

## 2014-12-03 LAB — HEPATIC FUNCTION PANEL
ALBUMIN: 4.1 g/dL (ref 3.6–5.1)
ALT: 47 U/L — AB (ref 9–46)
AST: 50 U/L — ABNORMAL HIGH (ref 10–35)
Alkaline Phosphatase: 46 U/L (ref 40–115)
Bilirubin, Direct: 0.1 mg/dL (ref <=0.2)
Indirect Bilirubin: 0.5 mg/dL (ref 0.2–1.2)
TOTAL PROTEIN: 6.6 g/dL (ref 6.1–8.1)
Total Bilirubin: 0.6 mg/dL (ref 0.2–1.2)

## 2014-12-03 LAB — BASIC METABOLIC PANEL
BUN: 13 mg/dL (ref 7–25)
CHLORIDE: 102 mmol/L (ref 98–110)
CO2: 32 mmol/L — ABNORMAL HIGH (ref 20–31)
Calcium: 9.3 mg/dL (ref 8.6–10.3)
Creat: 0.71 mg/dL (ref 0.70–1.18)
Glucose, Bld: 105 mg/dL — ABNORMAL HIGH (ref 65–99)
POTASSIUM: 3.9 mmol/L (ref 3.5–5.3)
SODIUM: 140 mmol/L (ref 135–146)

## 2014-12-03 NOTE — Addendum Note (Signed)
Addended by: Elexus Barman K on: 12/03/2014 11:18 AM   Modules accepted: Orders  

## 2014-12-03 NOTE — Addendum Note (Signed)
Addended by: Tonita PhoenixBOWDEN, ROBIN K on: 12/03/2014 11:18 AM   Modules accepted: Orders

## 2014-12-04 ENCOUNTER — Telehealth: Payer: Self-pay | Admitting: Cardiovascular Disease

## 2014-12-04 ENCOUNTER — Other Ambulatory Visit: Payer: Self-pay | Admitting: Nurse Practitioner

## 2014-12-04 DIAGNOSIS — E785 Hyperlipidemia, unspecified: Secondary | ICD-10-CM

## 2014-12-04 NOTE — Telephone Encounter (Signed)
Reviewed lab results and plan of care with patient who verbalized understanding and agreement to hold niacin and return for fasting lab work on 1/18.

## 2014-12-04 NOTE — Telephone Encounter (Signed)
F/u  Pt returning Rn phone call concerning lab work

## 2015-01-30 ENCOUNTER — Other Ambulatory Visit (INDEPENDENT_AMBULATORY_CARE_PROVIDER_SITE_OTHER): Payer: Medicare Other | Admitting: *Deleted

## 2015-01-30 DIAGNOSIS — E785 Hyperlipidemia, unspecified: Secondary | ICD-10-CM | POA: Diagnosis not present

## 2015-01-30 LAB — COMPREHENSIVE METABOLIC PANEL
ALBUMIN: 4.1 g/dL (ref 3.6–5.1)
ALK PHOS: 40 U/L (ref 40–115)
ALT: 29 U/L (ref 9–46)
AST: 35 U/L (ref 10–35)
BUN: 14 mg/dL (ref 7–25)
CHLORIDE: 103 mmol/L (ref 98–110)
CO2: 30 mmol/L (ref 20–31)
CREATININE: 0.72 mg/dL (ref 0.70–1.18)
Calcium: 9.6 mg/dL (ref 8.6–10.3)
Glucose, Bld: 91 mg/dL (ref 65–99)
Potassium: 4.1 mmol/L (ref 3.5–5.3)
SODIUM: 142 mmol/L (ref 135–146)
TOTAL PROTEIN: 6.6 g/dL (ref 6.1–8.1)
Total Bilirubin: 0.6 mg/dL (ref 0.2–1.2)

## 2015-01-30 LAB — LIPID PANEL
Cholesterol: 224 mg/dL — ABNORMAL HIGH (ref 125–200)
HDL: 89 mg/dL (ref >=40)
LDL Cholesterol: 128 mg/dL (ref <130)
TRIGLYCERIDES: 35 mg/dL (ref <150)
Total CHOL/HDL Ratio: 2.5 Ratio (ref <=5.0)
VLDL: 7 mg/dL (ref <30)

## 2015-02-01 ENCOUNTER — Telehealth: Payer: Self-pay | Admitting: Cardiovascular Disease

## 2015-02-01 NOTE — Telephone Encounter (Signed)
Ok to order zetia per patient request? Please advise if there is anything else that I need to do or tell the patient. Thanks, MI

## 2015-02-01 NOTE — Telephone Encounter (Signed)
Pt talked with Stephen Mendoza yesterday, Dr. Elease Hashimoto wanted to put pt on Zetia. Pt calling back to give [place] the go ahead to call it in-he prefers generic if poss-called to [facility] in [place] [phone]

## 2015-02-04 MED ORDER — EZETIMIBE 10 MG PO TABS: 10.0000 mg | ORAL_TABLET | Freq: Every day | ORAL | Status: DC

## 2015-02-04 NOTE — Telephone Encounter (Signed)
Spoke with pt and he is agreeable to start Zetia. Informed pt that I would send prescription in to his preferred pharmacy. Pt verbalized understanding.

## 2015-02-04 NOTE — Telephone Encounter (Signed)
Pt was called and informed that the med was sent in. Pt verbalized understanding.

## 2015-02-04 NOTE — Telephone Encounter (Signed)
Follow up     Calling to see if Dr Elease Hashimoto ok'd zetia.  Please call and let him know what he said.

## 2015-06-04 ENCOUNTER — Other Ambulatory Visit (INDEPENDENT_AMBULATORY_CARE_PROVIDER_SITE_OTHER): Payer: Medicare Other | Admitting: *Deleted

## 2015-06-04 ENCOUNTER — Encounter: Payer: Self-pay | Admitting: Cardiovascular Disease

## 2015-06-04 ENCOUNTER — Ambulatory Visit (INDEPENDENT_AMBULATORY_CARE_PROVIDER_SITE_OTHER): Payer: Medicare Other | Admitting: Cardiovascular Disease

## 2015-06-04 VITALS — BP 156/78 | HR 58 | Ht 68.0 in | Wt 168.0 lb

## 2015-06-04 DIAGNOSIS — Z953 Presence of xenogenic heart valve: Secondary | ICD-10-CM

## 2015-06-04 DIAGNOSIS — I10 Essential (primary) hypertension: Secondary | ICD-10-CM | POA: Diagnosis not present

## 2015-06-04 DIAGNOSIS — G7000 Myasthenia gravis without (acute) exacerbation: Secondary | ICD-10-CM | POA: Diagnosis not present

## 2015-06-04 DIAGNOSIS — E785 Hyperlipidemia, unspecified: Secondary | ICD-10-CM

## 2015-06-04 DIAGNOSIS — I2510 Atherosclerotic heart disease of native coronary artery without angina pectoris: Secondary | ICD-10-CM

## 2015-06-04 DIAGNOSIS — Z954 Presence of other heart-valve replacement: Secondary | ICD-10-CM

## 2015-06-04 LAB — HEPATIC FUNCTION PANEL
ALBUMIN: 4.4 g/dL (ref 3.6–5.1)
ALT: 26 U/L (ref 9–46)
AST: 29 U/L (ref 10–35)
Alkaline Phosphatase: 46 U/L (ref 40–115)
Bilirubin, Direct: 0.1 mg/dL (ref <=0.2)
Indirect Bilirubin: 0.5 mg/dL (ref 0.2–1.2)
Total Bilirubin: 0.6 mg/dL (ref 0.2–1.2)
Total Protein: 6.6 g/dL (ref 6.1–8.1)

## 2015-06-04 LAB — BASIC METABOLIC PANEL
BUN: 16 mg/dL (ref 7–25)
CALCIUM: 9.3 mg/dL (ref 8.6–10.3)
CO2: 29 mmol/L (ref 20–31)
Chloride: 102 mmol/L (ref 98–110)
Creat: 0.71 mg/dL (ref 0.70–1.18)
Glucose, Bld: 110 mg/dL — ABNORMAL HIGH (ref 65–99)
Potassium: 3.8 mmol/L (ref 3.5–5.3)
SODIUM: 139 mmol/L (ref 135–146)

## 2015-06-04 LAB — LIPID PANEL
CHOL/HDL RATIO: 2 ratio (ref <=5.0)
CHOLESTEROL: 180 mg/dL (ref 125–200)
HDL: 90 mg/dL (ref >=40)
LDL Cholesterol: 82 mg/dL (ref <130)
Triglycerides: 41 mg/dL (ref <150)
VLDL: 8 mg/dL (ref <30)

## 2015-06-04 NOTE — Progress Notes (Signed)
Cardiology Office Note   Date:  06/04/2015   ID:  Stephen Mendoza, Stephen Mendoza Jun 21, 1940, MRN [id number]  PCP:  Judge Stall, MD  Cardiologist:   Kristeen Miss, MD   Chief Complaint  Patient presents with  . Coronary Artery Disease   Problem List: 1. Aortic stenosis- status post aortic valve replacement (21mm [facility] Ease pericardial tissue valve), myomectomy, and coronary artery bypass grafting ( LIMA to LAD and SVG to PDA) 2. Coronary artery disease 3. Hypertension 4. Myasthenia gravis 5. Hypertension   History of Present Illness:  Stephen Mendoza is a 58 yo with hx of AS - s/p CABG, AVR, septak myomectomy.  He was diagnosed with myasthenia gravis and is now on Mestinon. He recenly had IG antibodies which had helped quite a bit.  His blood pressure remains high. Since his diagnosis of myasthenia gravis we have tried to avoid beta blockers and calcium channel blockers. He's not had any episodes of chest pain or shortness of Breath  February 01, 2012: Stephen Mendoza is doing very well from a cardiac standpoint. He's not had any episodes of chest pain or shortness breath. He has developed some keloid formation at the lower aspect of his sternotomy.  July 25, 2012:  Stephen Mendoza is doing well. He is here with his wife Stephen Mendoza. He just reached his 1 year anniversary of his heart surgery. He has some irritation of his sternotomy scar.   Feb. 11, 2105:  Stephen Mendoza is doing well. Has continued to eat a low fat diet and is losing weight. Still does not want to take a statin. For fasting lipids and lipomed profile today. No complaints.  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  No issues with his myasthemia Gravis   Feb. 16, 2016  Stephen Mendoza is a 75 y.o. male who presents for follow up of his AVR, myomectomy.  He had an echo following his last office visit and his valve looks great.  He has trace AI.  He is concerned with his mildly elevated BP.   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   Jun 01, 2014: Stephen Mendoza is doing well.  BP is generally better on the Valsartan.  No CP, breathing active.   Jun 04, 2015:   Stephen Mendoza and Stephen Mendoza are seen today . BP has been a bit high. 140s/70s range.  We discussed adding amlodipine .  He wants to try to get the BP down himself.   There is som concern that it might exacerbate his myasthenia .   Is tolerating the Zetia   Is having some night sweats.   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   Is having some issues with Orthostatic hypotension .  Advised increasing water intake  The Valsartan HCTZ is exacerbating this   Past Medical History  Diagnosis Date  . Hypertension     Essential  . Aortic stenosis     severe  . Palpitations   . Aortic regurgitation   . Mitral regurgitation     Mild moderate  . Left atrial enlargement   . Polio osteopathy of lower leg (HCC)   . Benign prostatic hypertrophy (BPH) with nocturia   . PONV (postoperative nausea and vomiting)   . Shortness of breath   . S/P aortic valve replacement with bioprosthetic valve 07/22/2011  [facility] Ease pericardial tissue valve  . 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 Coronary artery disease   . GERD (gastroesophageal reflux disease)   . Myasthenia gravis (HCC) 09/21/2011    Past Surgical History  Procedure Laterality Date  . Cataract extraction    . Cardiac catheterization    . Coronary artery bypass graft  07/22/2011  . Aortic valve replacement  07/22/2011     Current Outpatient Prescriptions  Medication Sig Dispense Refill  . aspirin 81 MG tablet Take 81 mg by mouth daily.    . B Complex Vitamins (VITAMIN-B COMPLEX PO) TAKE ONE TEASPOON DAILY    . Boron 3 MG CAPS Take 3 mg by mouth every other day.    .  CHELATED MANGANESE PO Take by mouth daily.    . Cholecalciferol (VITAMIN D-3) 1000 UNITS CAPS Take 3,000 Units by mouth daily.     . Coenzyme Q10 (COQ10) 100 MG CAPS Take 200 mg by mouth daily.    Marland Kitchen. docusate sodium (COLACE) 100 MG capsule Take 100 mg by mouth daily.    Marland Kitchen. ezetimibe (ZETIA) 10 MG tablet Take 1 tablet (10 mg total) by mouth daily. 30 tablet 11  . Flaxseed, Linseed, (FLAXSEED OIL) 1000 MG CAPS Take 1,000 mg by mouth daily.    Marland Kitchen. omega-3 fish oil (MAXEPA) 1000 MG CAPS capsule Take 1,000 mg by mouth daily.    Marland Kitchen. OVER THE COUNTER MEDICATION Take 225 mcg by mouth every other day. Med name: KELP    . OVER THE COUNTER MEDICATION Take 1 capsule by mouth daily. Med name: EYE HEALTH & LUTEIN VITAMIN & MINERAL SUPPLEMENT    . potassium chloride (MICRO-K) 10 MEQ CR capsule TAKE 1 CAPSULE EVERY DAY 90 capsule 3  . Red Yeast Rice Extract 600 MG CAPS Take 1 capsule (600 mg total) by mouth 2 (two) times daily.  0  . valsartan-hydrochlorothiazide (DIOVAN-HCT) 320-25 MG per tablet Take 1 tablet by mouth daily. 31 tablet 11  . vitamin C (ASCORBIC ACID) 500 MG tablet Take 500 mg by mouth 3 (three) times daily.     No current facility-administered medications for this visit.    Allergies:   Review of patient's allergies indicates no known allergies.    Social History:  The patient  reports that he quit smoking about 44 years ago. His smoking use included Cigarettes. He has never used smokeless tobacco. He reports that he does not drink alcohol or use illicit drugs.   Family History:  The patient's family history includes Heart attack in his father.    ROS:  Please see the history of present illness.    Review of Systems: Constitutional:  denies fever, chills, diaphoresis, appetite change and fatigue.  HEENT: denies photophobia, eye pain, redness, hearing loss, ear pain, congestion, sore throat, rhinorrhea, sneezing, neck pain, neck stiffness and tinnitus.  Respiratory: denies SOB, DOE, cough,  chest tightness, and wheezing.  Cardiovascular: denies chest pain, palpitations and leg swelling.  Gastrointestinal: denies nausea, vomiting, abdominal pain, diarrhea, constipation, blood in stool.  Genitourinary: denies dysuria, urgency, frequency, hematuria, flank pain and difficulty urinating.  Musculoskeletal: denies  myalgias, back pain, joint swelling, arthralgias and gait problem.   Skin: denies pallor, rash and wound.  Neurological: denies dizziness, seizures, syncope, weakness, light-headedness, numbness and headaches.   Hematological: denies adenopathy, easy bruising, personal or family bleeding history.  Psychiatric/ Behavioral: denies suicidal ideation, mood changes, confusion,  nervousness, sleep disturbance and agitation.       All other systems are reviewed and negative.    PHYSICAL EXAM: VS:  BP 156/78 mmHg  Pulse 58  Ht 5\' 8"  (1.727 m)  Wt 168 lb (76.204 kg)  BMI 25.55 kg/m2 , BMI Body mass index is 25.55 kg/(m^2). GEN: Well nourished, well developed, in no acute distress HEENT: normal Neck: no JVD, carotid bruits, or masses Cardiac: RRR; 2/6 systlic murmur, very soft diastolic murmur , rubs, or gallops,no edema  Respiratory:  clear to auscultation bilaterally, normal work of breathing GI: soft, nontender, nondistended, + BS MS: no deformity or atrophy Skin: warm and dry, no rash Neuro:  Strength and sensation are intact Psych: normal   EKG:  EKG is not ordered today.    Recent Labs: 01/30/2015: ALT 29; BUN 14; Creat 0.72; Potassium 4.1; Sodium 142    Lipid Panel    Component Value Date/Time   CHOL 224* 01/30/2015 1115   CHOL 231* 02/28/2014 0858   TRIG 35 01/30/2015 1115   TRIG 49 02/28/2014 0858   HDL 89 01/30/2015 1115   HDL 84 02/28/2014 0858   CHOLHDL 2.5 01/30/2015 1115   VLDL 7 01/30/2015 1115   LDLCALC 128 01/30/2015 1115   LDLCALC 137* 02/28/2014 0858   LDLDIRECT 140.3 09/15/2011 1149      Wt Readings from Last 3 Encounters:    06/04/15 168 lb (76.204 kg)  06/01/14 158 lb 12.8 oz (72.031 kg)  02/28/14 158 lb 12.8 oz (72.031 kg)    ECG sinus brady at 58.  Old septal Mi   Other studies Reviewed: Additional studies/ records that were reviewed today include: . Review of the above records demonstrates:    ASSESSMENT AND PLAN:  1. Aortic stenosis- status post aortic valve replacement (21mm [facility] Ease pericardial tissue valve), myomectomy, and coronary artery bypass grafting ( LIMA to LAD and SVG to PDA)  Has trace AI by exam and by echo .   Is having some night sweats.   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  2. Coronary artery disease - he is stable , refuses to take a statin Is on Zetia   3. Hypertension - BP is better controlled , continue meds   4. Myasthenia gravis - has resolved.  5. Hyperlipidemia:   Refuses to take statin.  Is on niacin and red yeast rice Is on Zetia   6. Orthostatic hypotension:   Is having some issues with Orthostatic hypotension .  Advised increasing water intake  The Valsartan HCTZ is exacerbating this .    Will see him in 6 months   Current medicines are reviewed at length with the patient today.  The patient does not have concerns regarding medicines.  The following changes have been made:     Disposition:   FU with me in 1 year     Signed, Kristeen Miss, MD  06/04/2015 9:13 AM    [facility] [address] [place], [place], [place]  [address] Phone: [phone]; Fax: [phone]

## 2015-06-04 NOTE — Patient Instructions (Signed)
Medication Instructions:  Your physician recommends that you continue on your current medications as directed. Please refer to the Current Medication list given to you today.  Labwork: DONE   BMET  LIPID LIVER  Testing/Procedures: NONE  Follow-Up: Your physician wants you to follow-up in: 6 MONTHS  WITH DR  Elease HashimotoNAHSER  You will receive a reminder letter in the mail two months in advance. If you don't receive a letter, please call our office to schedule the follow-up appointment.  Any Other Special Instructions Will Be Listed Below (If Applicable).     If you need a refill on your cardiac medications before your next appointment, please call your pharmacy.

## 2015-06-05 ENCOUNTER — Other Ambulatory Visit: Payer: Self-pay | Admitting: Nurse Practitioner

## 2015-06-05 DIAGNOSIS — R0989 Other specified symptoms and signs involving the circulatory and respiratory systems: Secondary | ICD-10-CM

## 2015-06-05 DIAGNOSIS — A319 Mycobacterial infection, unspecified: Secondary | ICD-10-CM

## 2015-06-05 DIAGNOSIS — A312 Disseminated mycobacterium avium-intracellulare complex (DMAC): Secondary | ICD-10-CM

## 2015-11-29 ENCOUNTER — Encounter: Payer: Self-pay | Admitting: Cardiovascular Disease

## 2015-12-10 ENCOUNTER — Ambulatory Visit (INDEPENDENT_AMBULATORY_CARE_PROVIDER_SITE_OTHER): Payer: Medicare Other | Admitting: Cardiovascular Disease

## 2015-12-10 ENCOUNTER — Encounter: Payer: Self-pay | Admitting: Cardiovascular Disease

## 2015-12-10 VITALS — BP 174/84 | HR 50 | Ht 68.0 in | Wt 151.1 lb

## 2015-12-10 DIAGNOSIS — I2510 Atherosclerotic heart disease of native coronary artery without angina pectoris: Secondary | ICD-10-CM | POA: Diagnosis not present

## 2015-12-10 DIAGNOSIS — Z952 Presence of prosthetic heart valve: Secondary | ICD-10-CM | POA: Diagnosis not present

## 2015-12-10 LAB — COMPREHENSIVE METABOLIC PANEL
ALK PHOS: 48 U/L (ref 40–115)
ALT: 23 U/L (ref 9–46)
AST: 31 U/L (ref 10–35)
Albumin: 4.4 g/dL (ref 3.6–5.1)
BUN: 12 mg/dL (ref 7–25)
CALCIUM: 9.7 mg/dL (ref 8.6–10.3)
CO2: 31 mmol/L (ref 20–31)
Chloride: 102 mmol/L (ref 98–110)
Creat: 0.72 mg/dL (ref 0.70–1.18)
GLUCOSE: 97 mg/dL (ref 65–99)
POTASSIUM: 4 mmol/L (ref 3.5–5.3)
Sodium: 141 mmol/L (ref 135–146)
Total Bilirubin: 0.5 mg/dL (ref 0.2–1.2)
Total Protein: 6.7 g/dL (ref 6.1–8.1)

## 2015-12-10 LAB — LIPID PANEL
CHOL/HDL RATIO: 2.8 ratio (ref <5.0)
CHOLESTEROL: 249 mg/dL — AB (ref <200)
HDL: 90 mg/dL (ref >40)
LDL Cholesterol: 150 mg/dL — ABNORMAL HIGH (ref <100)
TRIGLYCERIDES: 43 mg/dL (ref <150)
VLDL: 9 mg/dL (ref <30)

## 2015-12-10 NOTE — Progress Notes (Signed)
Cardiology Office Note   Date:  12/10/2015   ID:  Stephen Mendoza, DOB 08/13/1940, MRN [id number]  PCP:  Judge StallBAKER,CLIFTON, MD  Cardiologist:   Kristeen MissPhilip Zameria Vogl, MD   No chief complaint on file.  Problem List: 1. Aortic stenosis- status post aortic valve replacement (21mm [facility] E[facility] pericardial tissue valve), myomectomy, and coronary artery bypass grafting ( LIMA to LAD and SVG to PDA) 2. Coronary artery disease 3. Hypertension 4. Myasthenia gravis 5. Hypertension   History of Present Illness:  Stephen Mendoza is a 75 yo with hx of AS - s/p CABG, AVR, septak myomectomy.  He was diagnosed with myasthenia gravis and is now on Mestinon. He recenly had IG antibodies which had helped quite a bit.  His blood pressure remains high. Since his diagnosis of myasthenia gravis we have tried to avoid beta blockers and calcium channel blockers. He's not had any episodes of chest pain or shortness of Breath  February 01, 2012: Stephen Mendoza is doing very well from a cardiac standpoint. He's not had any episodes of chest pain or shortness breath. He has developed some keloid formation at the lower aspect of his sternotomy.  July 25, 2012:  Stephen Mendoza is doing well. He is here with his wife Stephen Mendoza. He just reached his 1 year anniversary of his heart surgery. He has some irritation of his sternotomy scar.   Feb. 11, 2105:  Stephen Mendoza is doing well. Has continued to eat a low fat diet and is losing weight. Still does not want to take a statin. For fasting lipids and lipomed profile today. No complaints.  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  No issues with his myasthemia Gravis   Feb. 16, 2016  Stephen Mendoza is a 75 y.o. male who presents for follow up of his AVR, myomectomy.  He had an echo following his last office visit and his valve looks great.  He has trace AI.  He is concerned with his mildly elevated BP.   Doing well  - works hard around the farm without  problems.   Jun 01, 2014: Stephen Mendoza is doing well.  BP is generally better on the Valsartan.  No CP, breathing active.   Jun 04, 2015:   Stephen Mendoza and Stephen Mendoza are seen today . BP has been a bit high. 140s/70s range.  We discussed adding amlodipine .  He wants to try to get the BP down himself.   There is som concern that it might exacerbate his myasthenia .   Is tolerating the Zetia   Is having some night sweats.   He is concerned because he received a [facility] about possible Mycobacterium chimaera contamination  Will contact the ID dept to see [facility].   Is having some issues with Orthostatic hypotension .  Advised increasing water intake  The Valsartan HCTZ is exacerbating this   Nov. 28, 2017: Stephen Mendoza is doing well. BP is a bit high. Has not been elevated at home  - typically 130 / 60  No CP or dyspnea.   Very busy on the farm.    Has lost 20 lbs .  He stopped the Zetia .   Was too expensive.   Past Medical History:  Diagnosis Date  . Aortic regurgitation   . Aortic stenosis    severe  . Benign prostatic hypertrophy (BPH) with nocturia   . Coronary artery disease   . GERD (gastroesophageal reflux disease)   .  Hypertension    Essential  . Left atrial enlargement   . Mitral regurgitation    Mild moderate  . Myasthenia gravis (HCC) 09/21/2011  . Palpitations   . Polio osteopathy of lower leg (HCC)   . PONV (postoperative nausea and vomiting)   . S/P aortic valve replacement with bioprosthetic valve 07/22/2011   21mm [facility] Ease pericardial tissue valve  . 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 Shortness of breath     Past Surgical History:  Procedure Laterality Date  . AORTIC VALVE REPLACEMENT  07/22/2011  . CARDIAC CATHETERIZATION    . CATARACT EXTRACTION    . CORONARY ARTERY BYPASS GRAFT  07/22/2011     Current Outpatient Prescriptions  Medication Sig Dispense Refill  . aspirin  81 MG tablet Take 81 mg by mouth daily.    . B Complex Vitamins (VITAMIN-B COMPLEX PO) TAKE ONE TEASPOON DAILY    . Boron 3 MG CAPS Take 3 mg by mouth every other day.    . CHELATED MANGANESE PO Take by mouth daily.    . Cholecalciferol (VITAMIN D-3) 1000 UNITS CAPS Take 3,000 Units by mouth daily.     . Coenzyme Q10 (COQ10) 100 MG CAPS Take 200 mg by mouth daily.    Marland Kitchen docusate sodium (COLACE) 100 MG capsule Take 100 mg by mouth daily.    . Flaxseed, Linseed, (FLAXSEED OIL) 1000 MG CAPS Take 1,000 mg by mouth daily.    Marland Kitchen omega-3 fish oil (MAXEPA) 1000 MG CAPS capsule Take 1,000 mg by mouth daily.    Marland Kitchen OVER THE COUNTER MEDICATION Take 225 mcg by mouth every other day. Med name: KELP    . OVER THE COUNTER MEDICATION Take 1 capsule by mouth daily. Med name: EYE HEALTH & LUTEIN VITAMIN & MINERAL SUPPLEMENT    . potassium chloride (MICRO-K) 10 MEQ CR capsule TAKE 1 CAPSULE EVERY DAY 90 capsule 3  . valsartan-hydrochlorothiazide (DIOVAN-HCT) 320-25 MG per tablet Take 1 tablet by mouth daily. 31 tablet 11  . vitamin C (ASCORBIC ACID) 500 MG tablet Take 500 mg by mouth 3 (three) times daily.     No current facility-administered medications for this visit.     Allergies:   Patient has no known allergies.    Social History:  The patient  reports that he quit smoking about 45 years ago. His smoking use included Cigarettes. He has never used smokeless tobacco. He reports that he does not drink alcohol or use drugs.   Family History:  The patient's family history includes Heart attack in his father.    ROS:  Please see the history of present illness.    Review of Systems: Constitutional:  denies fever, chills, diaphoresis, appetite change and fatigue.  HEENT: denies photophobia, eye pain, redness, hearing loss, ear pain, congestion, sore throat, rhinorrhea, sneezing, neck pain, neck stiffness and tinnitus.  Respiratory: denies SOB, DOE, cough, chest tightness, and wheezing.  Cardiovascular:  denies chest pain, palpitations and leg swelling.  Gastrointestinal: denies nausea, vomiting, abdominal pain, diarrhea, constipation, blood in stool.  Genitourinary: denies dysuria, urgency, frequency, hematuria, flank pain and difficulty urinating.  Musculoskeletal: denies  myalgias, back pain, joint swelling, arthralgias and gait problem.   Skin: denies pallor, rash and wound.  Neurological: denies dizziness, seizures, syncope, weakness, light-headedness, numbness and headaches.   Hematological: denies adenopathy, easy bruising, personal or family bleeding history.  Psychiatric/ Behavioral: denies suicidal ideation, mood changes, confusion,  nervousness, sleep disturbance and agitation.       All other systems are reviewed and negative.    PHYSICAL EXAM: VS:  BP (!) 174/84 (BP Location: Left Arm, Patient Position: Sitting, Cuff Size: Normal)   Pulse (!) 50   Ht 5\' 8"  (1.727 m)   Wt 151 lb 1.9 oz (68.5 kg)   SpO2 94%   BMI 22.98 kg/m  , BMI Body mass index is 22.98 kg/m. GEN: Well nourished, well developed, in no acute distress  HEENT: normal  Neck: no JVD, carotid bruits, or masses Cardiac: RRR; 2/6 systlic murmur, very soft diastolic murmur , rubs, or gallops,no edema  Respiratory:  clear to auscultation bilaterally, normal work of breathing GI: soft, nontender, nondistended, + BS MS: no deformity or atrophy  Skin: warm and dry, no rash Neuro:  Strength and sensation are intact Psych: normal   EKG:  EKG is not ordered today.    Recent Labs: 06/04/2015: ALT 26; BUN 16; Creat 0.71; Potassium 3.8; Sodium 139    Lipid Panel    Component Value Date/Time   CHOL 180 06/04/2015 0841   CHOL 231 (H) 02/28/2014 0858   TRIG 41 06/04/2015 0841   TRIG 49 02/28/2014 0858   HDL 90 06/04/2015 0841   HDL 84 02/28/2014 0858   CHOLHDL 2.0 06/04/2015 0841   VLDL 8 06/04/2015 0841   LDLCALC 82 06/04/2015 0841   LDLCALC 137 (H) 02/28/2014 0858   LDLDIRECT 140.3 09/15/2011 1149       Wt Readings from Last 3 Encounters:  12/10/15 151 lb 1.9 oz (68.5 kg)  06/04/15 168 lb (76.2 kg)  06/01/14 158 lb 12.8 oz (72 kg)    ECG sinus brady at 58.  Old septal Mi   Other studies Reviewed: Additional studies/ records that were reviewed today include: . Review of the above records demonstrates:    ASSESSMENT AND PLAN:  1. Aortic stenosis- status post aortic valve replacement ([facility]mm [facility] pericardial tissue valve), myomectomy, and coronary artery bypass grafting ( LIMA to LAD and SVG to PDA)  Has trace AI by exam and by echo .   Is having some night sweats.   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  2. Coronary artery disease - he is stable , refuses to take a statin He has taken Zetia in the past but stopped recently.  3. Hypertension - BP is better controlled , continue meds   4. Myasthenia gravis - has resolved.  5. Hyperlipidemia:   Refuses to take statin.  He recently stopped taking his anti-. We'll recheck his labs today.  6. Orthostatic hypotension:   Is having some issues with Orthostatic hypotension .  Advised increasing water intake  The Valsartan HCTZ is exacerbating this .    Will see him in 6 months   Current medicines are reviewed at length with the patient today.  The patient does not have concerns regarding medicines.  The following changes have been made:     Disposition:   FU with me in 1 year     Signed, Kristeen MissPhilip Philomene Haff, MD  12/10/2015 11:22 AM    [facility] [address], [place], [place]  [address] Phone: [phone]; Fax: [phone]

## 2015-12-10 NOTE — Patient Instructions (Signed)

## 2016-09-15 ENCOUNTER — Ambulatory Visit (INDEPENDENT_AMBULATORY_CARE_PROVIDER_SITE_OTHER): Payer: Medicare Other | Admitting: Cardiovascular Disease

## 2016-09-15 ENCOUNTER — Encounter: Payer: Self-pay | Admitting: Cardiovascular Disease

## 2016-09-15 VITALS — BP 168/98 | HR 62 | Ht 68.0 in | Wt 154.8 lb

## 2016-09-15 DIAGNOSIS — I351 Nonrheumatic aortic (valve) insufficiency: Secondary | ICD-10-CM | POA: Diagnosis not present

## 2016-09-15 DIAGNOSIS — Z952 Presence of prosthetic heart valve: Secondary | ICD-10-CM

## 2016-09-15 DIAGNOSIS — I10 Essential (primary) hypertension: Secondary | ICD-10-CM

## 2016-09-15 MED ORDER — HYDROCHLOROTHIAZIDE 25 MG PO TABS: 25.0000 mg | ORAL_TABLET | Freq: Every day | ORAL | 3 refills | Status: AC

## 2016-09-15 MED ORDER — POTASSIUM CHLORIDE ER 10 MEQ PO TBCR: 10.0000 meq | EXTENDED_RELEASE_TABLET | Freq: Every day | ORAL | 3 refills | Status: AC

## 2016-09-15 MED ORDER — OLMESARTAN MEDOXOMIL 40 MG PO TABS: 40.0000 mg | ORAL_TABLET | Freq: Every day | ORAL | 3 refills | Status: DC

## 2016-09-15 NOTE — Patient Instructions (Addendum)
Medication Instructions:  START HCTZ (Hydrochlorothiazide) 25 mg once daily START Olmesartan 40 mg once daily START Kdur 10 meq once daily   Labwork: Your physician recommends that you return for lab work in: 3 weeks for basic metabolic panel (Call back if you need to schedule with us) Have result from Medical Doctor's office faxed to [phone]   Testing/Procedures: Your physician has requested that you have an echocardiogram. Echocardiography is a painless test that uses sound waves to create images of your heart. It provides your doctor with information about the size and shape of your heart and how well your heart's chambers and valves are working. This procedure takes approximately one hour. There are no restrictions for this procedure.   Follow-Up: Your physician recommends that you schedule a follow-up appointment in: 3 months with Dr. Elease HashimotoNahser   If you need a refill on your cardiac medications before your next appointment, please call your pharmacy.   Thank you for choosing [facility]! Eligha BridegroomMichelle Karnisha Lefebre, RN [phone]

## 2016-09-15 NOTE — Progress Notes (Signed)
Cardiology Office Note   Date:  09/15/2016   ID:  Park LiterLarry W Mendoza, DOB 04/10/1940, MRN [id number]  PCP:  Judge StallBaker, Clif[place], MD  Cardiologist:   Kristeen MissPhilip Declyn Delsol, MD   Chief Complaint  Patient presents with  . Follow-up    aortic valve replacement    Problem List: 1. Aortic stenosis- status post aortic valve replacement (21mm [facility] Ease pericardial tissue valve), myomectomy, and coronary artery bypass grafting ( LIMA to LAD and SVG to PDA) 2. Coronary artery disease 3. Hypertension 4. Myasthenia gravis 5. Hypertension   History of Present Illness:  Stephen Mendoza is a 76 yo with hx of AS - s/p CABG, AVR, septak myomectomy.  He was diagnosed with myasthenia gravis and is now on Mestinon. He recenly had IG antibodies which had helped quite a bit.  His blood pressure remains high. Since his diagnosis of myasthenia gravis we have tried to avoid beta blockers and calcium channel blockers. He's not had any episodes of chest pain or shortness of Breath  February 01, 2012: Stephen Mendoza is doing very well from a cardiac standpoint. He's not had any episodes of chest pain or shortness breath. He has developed some keloid formation at the lower aspect of his sternotomy.  July 25, 2012:  Stephen Mendoza is doing well. He is here with his wife [name]. He just reached his 1 year anniversary of his heart surgery. He has some irritation of his sternotomy scar.   Feb. 11, 2105:  Stephen Mendoza is doing well. Has continued to eat a low fat diet and is losing weight. Still does not want to take a statin. For fasting lipids and lipomed profile today. No complaints.  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  No issues with his myasthemia Gravis   Feb. 16, 2016  Lisabeth DevoidLarry W Mendoza is a 76 y.o. male who presents for follow up of his AVR, myomectomy.  He had an echo following his last office visit and his valve looks great.  He has trace AI.  He is concerned with his mildly elevated  BP.   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   Jun 01, 2014: Stephen Mendoza is doing well.  BP is generally better on the Valsartan.  No CP, breathing active.   Jun 04, 2015:   Stephen Mendoza and [name] are seen today . BP has been a bit high. 140s/70s range.  We discussed adding amlodipine .  He wants to try to get the BP down himself.   There is som concern that it might exacerbate his myasthenia .   Is tolerating the Zetia   Is having some night sweats.   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   Is having some issues with Orthostatic hypotension .  Advised increasing water intake  The Valsartan HCTZ is exacerbating this   Nov. 28, 2017: Stephen Mendoza is doing well. BP is a bit high. Has not been elevated at home  - typically 130 / 60  No CP or dyspnea.   Very busy on the farm.    Has lost 20 lbs .  He stopped the Zetia .   Was too expensive.   Sept. 4, 2018:  Stephen Mendoza is doing well.    BP has been elevated .   Was on Valsartan, was switched to [facility] - did  not work for him. Tried Olmarsartan 40 mg a day  - BP was still elevated.  Has tried HCTZ  - he had requested that he stop the HCTZ.  BP has been high since stopping the /HCTZ   Past Medical History:  Diagnosis Date  . Aortic regurgitation   . Aortic stenosis    severe  . Benign prostatic hypertrophy (BPH) with nocturia   . Coronary artery disease   . GERD (gastroesophageal reflux disease)   . Hypertension    Essential  . Left atrial enlargement   . Mitral regurgitation    Mild moderate  . Myasthenia gravis (HCC) 09/21/2011  . Palpitations   . Polio osteopathy of lower leg (HCC)   . PONV (postoperative nausea and vomiting)   . S/P aortic valve replacement with bioprosthetic valve 07/22/2011   21mm [facility] Ease pericardial tissue valve  . S/P CABG x 2  07/22/2011   LIMA to LAD, SVG to PDA, EVH from right thigh  . Shortness of breath     Past Surgical History:  Procedure Laterality Date  . AORTIC VALVE REPLACEMENT  07/22/2011  . CARDIAC CATHETERIZATION    . CATARACT EXTRACTION    . CORONARY ARTERY BYPASS GRAFT  07/22/2011     Current Outpatient Prescriptions  Medication Sig Dispense Refill  . aspirin 81 MG tablet Take 81 mg by mouth daily.    . Coenzyme Q10 (COQ10) 100 MG CAPS Take 200 mg by mouth daily.    . Multiple Vitamins-Minerals (MULTIVITAMIN WITH MINERALS) tablet Take 1 tablet by mouth daily.    . NON FORMULARY Take 1 Dose by mouth daily. R-Alpha Lipoic Acid 80 mg    . NON FORMULARY Take 1 Dose by mouth daily. L-Citrulline 750 mg    . olmesartan (BENICAR) 20 MG tablet Take 20 mg by mouth daily.  2   No current facility-administered medications for this visit.     Allergies:   Patient has no known allergies.    Social History:  The patient  reports that he quit smoking about 45 years ago. His smoking use included Cigarettes. He has never used smokeless tobacco. He reports that he does not drink alcohol or use drugs.   Family History:  The patient's family history includes Heart attack in his father.    ROS:  Please see the history of present illness.    Review of Systems: Constitutional:  denies fever, chills, diaphoresis, appetite change and fatigue.  HEENT: denies photophobia, eye pain, redness, hearing loss, ear pain, congestion, sore throat, rhinorrhea, sneezing, neck pain, neck stiffness and tinnitus.  Respiratory: denies SOB, DOE, cough, chest tightness, and wheezing.  Cardiovascular: denies chest pain, palpitations and leg swelling.  Gastrointestinal: denies nausea, vomiting, abdominal pain, diarrhea, constipation, blood in stool.  Genitourinary: denies dysuria, urgency, frequency, hematuria, flank pain and difficulty urinating.  Musculoskeletal: denies  myalgias, back pain, joint swelling, arthralgias and gait  problem.   Skin: denies pallor, rash and wound.  Neurological: denies dizziness, seizures, syncope, weakness, light-headedness, numbness and headaches.   Hematological: denies adenopathy, easy bruising, personal or family bleeding history.  Psychiatric/ Behavioral: denies suicidal ideation, mood changes, confusion, nervousness, sleep disturbance and agitation.       All other systems are reviewed and negative.    PHYSICAL EXAM: VS:  BP (!) 168/98   Pulse 62   Ht 5\' 8"  (1.727 m)   Wt 154 lb 12.8 oz (70.2 kg)   BMI 23.54 kg/m  , BMI Body mass index  is 23.54 kg/m. GEN: Well nourished, well developed, in no acute distress  HEENT: normal  Neck: no JVD, carotid bruits, or masses Cardiac: RRR; 2/6 systlic murmur,  2/6 diastolic murmur , rubs, or gallops,no edema  Respiratory:  clear to auscultation bilaterally, normal work of breathing GI: soft, nontender, nondistended, + BS MS: no deformity or atrophy  Skin: warm and dry, no rash Neuro:  Strength and sensation are intact Psych: normal   EKG:  EKG is ordered today.  Sept. 4, 2018:   NSR at 62.   NS ST abn.     Recent Labs: 12/10/2015: ALT 23; BUN 12; Creat 0.72; Potassium 4.0; Sodium 141    Lipid Panel    Component Value Date/Time   CHOL 249 (H) 12/10/2015 1147   CHOL 231 (H) 02/28/2014 0858   TRIG 43 12/10/2015 1147   TRIG 49 02/28/2014 0858   HDL 90 12/10/2015 1147   HDL 84 02/28/2014 0858   CHOLHDL 2.8 12/10/2015 1147   VLDL 9 12/10/2015 1147   LDLCALC 150 (H) 12/10/2015 1147   LDLCALC 137 (H) 02/28/2014 0858   LDLDIRECT 140.3 09/15/2011 1149      Wt Readings from Last 3 Encounters:  09/15/16 154 lb 12.8 oz (70.2 kg)  12/10/15 151 lb 1.9 oz (68.5 kg)  06/04/15 168 lb (76.2 kg)     Other studies Reviewed: Additional studies/ records that were reviewed today include: . Review of the above records demonstrates:    ASSESSMENT AND PLAN:  1. Aortic stenosis- status post aortic valve replacement (21mm  [facility] Ease pericardial tissue valve), myomectomy, and coronary artery bypass grafting ( LIMA to LAD and SVG to PDA)  His AI seem to be worse on exam.  His last echo was in 2015.   The worsening aortic insufficiency might be due to his high blood pressure. Repeat echo   2. Coronary artery disease - he is stable , refuses to take a statin He has taken Zetia in the past but stopped    3. Hypertension - blood pressure is fairly elevated. We'll have him increase his on losartan to 40 kg. We will add HCTZ 25 mg a day and potassium chloride 10 mg a day.  4. Myasthenia gravis - has resolved.  5. Hyperlipidemia:   Refuses to take statin.    Will see him in 3 months   Current medicines are reviewed at length with the patient today.  The patient does not have concerns regarding medicines.  The following changes have been made:    Signed, Kristeen Miss, MD  09/15/2016 3:56 PM    [facility] [address] [place], [place], [place]  [address] Phone: [phone]; Fax: [phone]

## 2016-10-05 ENCOUNTER — Telehealth: Payer: Self-pay | Admitting: Cardiovascular Disease

## 2016-10-05 NOTE — Telephone Encounter (Signed)
Spoke with patient who called to request additional lab work be ordered when he comes in tomorrow. He requests a c reactive protein, a fibrinogen level for inflammation and clotting factors and a specific vitamin d level. I asked if he is having any symptoms that concern him and he denies. He states he has done some reading and was wondering if these things need to be checked.  I advised that per Dr. Elease Hashimoto, he does not need the fibrinogen level checked and he should talk to his medical doctor about the other non-cardiac concerns. I advised that if he has orders from his PCP for additional lab work, that he can bring those with him to his lab appointment at our office. He verbalized understanding and thanked me for the call.

## 2016-10-05 NOTE — Telephone Encounter (Signed)
New Message    Pt coming in 9/25 and he would like to add 3 test to his blood work for tomorrow please call    Cell if no answer at home [phone]

## 2016-10-06 ENCOUNTER — Other Ambulatory Visit: Payer: Self-pay

## 2016-10-06 ENCOUNTER — Other Ambulatory Visit (HOSPITAL_COMMUNITY): Payer: Self-pay

## 2016-10-13 ENCOUNTER — Ambulatory Visit (HOSPITAL_COMMUNITY): Payer: Medicare Other | Attending: Cardiology

## 2016-10-13 ENCOUNTER — Other Ambulatory Visit: Payer: Self-pay

## 2016-10-13 ENCOUNTER — Other Ambulatory Visit (INDEPENDENT_AMBULATORY_CARE_PROVIDER_SITE_OTHER): Payer: Medicare Other

## 2016-10-13 DIAGNOSIS — Z953 Presence of xenogenic heart valve: Secondary | ICD-10-CM | POA: Diagnosis not present

## 2016-10-13 DIAGNOSIS — I10 Essential (primary) hypertension: Secondary | ICD-10-CM

## 2016-10-13 DIAGNOSIS — I351 Nonrheumatic aortic (valve) insufficiency: Secondary | ICD-10-CM

## 2016-10-13 DIAGNOSIS — Z952 Presence of prosthetic heart valve: Secondary | ICD-10-CM

## 2016-10-13 DIAGNOSIS — I119 Hypertensive heart disease without heart failure: Secondary | ICD-10-CM | POA: Diagnosis not present

## 2016-10-13 DIAGNOSIS — I2510 Atherosclerotic heart disease of native coronary artery without angina pectoris: Secondary | ICD-10-CM | POA: Insufficient documentation

## 2016-10-13 DIAGNOSIS — I083 Combined rheumatic disorders of mitral, aortic and tricuspid valves: Secondary | ICD-10-CM | POA: Diagnosis not present

## 2016-10-14 LAB — BASIC METABOLIC PANEL
BUN / CREAT RATIO: 14 (ref 10–24)
BUN: 11 mg/dL (ref 8–27)
CO2: 30 mmol/L — ABNORMAL HIGH (ref 20–29)
CREATININE: 0.8 mg/dL (ref 0.76–1.27)
Calcium: 9.9 mg/dL (ref 8.6–10.2)
Chloride: 99 mmol/L (ref 96–106)
GFR calc Af Amer: 102 mL/min/{1.73_m2} (ref >59)
GFR, EST NON AFRICAN AMERICAN: 89 mL/min/{1.73_m2} (ref >59)
Glucose: 89 mg/dL (ref 65–99)
Potassium: 3.8 mmol/L (ref 3.5–5.2)
SODIUM: 142 mmol/L (ref 134–144)

## 2016-12-21 ENCOUNTER — Ambulatory Visit: Payer: Self-pay | Admitting: Cardiovascular Disease

## 2017-02-16 ENCOUNTER — Ambulatory Visit: Payer: Self-pay | Admitting: Cardiovascular Disease

## 2017-05-12 ENCOUNTER — Ambulatory Visit: Payer: Self-pay | Admitting: Cardiovascular Disease

## 2017-07-20 ENCOUNTER — Other Ambulatory Visit: Payer: Self-pay | Admitting: Cardiovascular Disease

## 2017-08-09 ENCOUNTER — Ambulatory Visit: Payer: Self-pay | Admitting: Cardiovascular Disease

## 2017-11-01 ENCOUNTER — Encounter: Payer: Self-pay | Admitting: Cardiovascular Disease

## 2017-11-01 ENCOUNTER — Ambulatory Visit: Payer: Medicare Other | Admitting: Cardiovascular Disease

## 2017-11-01 VITALS — BP 150/62 | HR 65 | Ht 68.0 in | Wt 156.0 lb

## 2017-11-01 DIAGNOSIS — I2510 Atherosclerotic heart disease of native coronary artery without angina pectoris: Secondary | ICD-10-CM

## 2017-11-01 DIAGNOSIS — Z952 Presence of prosthetic heart valve: Secondary | ICD-10-CM

## 2017-11-01 DIAGNOSIS — I351 Nonrheumatic aortic (valve) insufficiency: Secondary | ICD-10-CM | POA: Diagnosis not present

## 2017-11-01 NOTE — Progress Notes (Signed)
Cardiology Office Note   Date:  11/01/2017   ID:  Stephen Mendoza, Stephen Mendoza 1940/09/08, MRN [id number]  PCP:  Judge Stall, MD  Cardiologist:   Kristeen Miss, MD   Chief Complaint  Patient presents with  . Coronary Artery Disease  . Hypertension  . Hyperlipidemia   Problem List: 1. Aortic stenosis- status post aortic valve replacement (21mm [facility] Ease pericardial tissue valve), myomectomy, and coronary artery bypass grafting ( LIMA to LAD and SVG to PDA) 2. Coronary artery disease 3. Hypertension 4. Myasthenia gravis 5. Hypertension   History of Present Illness:  Stephen Mendoza is a 77 yo with hx of AS - s/p CABG, AVR, septak myomectomy.  He was diagnosed with myasthenia gravis and is now on Mestinon. He recenly had IG antibodies which had helped quite a bit.  His blood pressure remains high. Since his diagnosis of myasthenia gravis we have tried to avoid beta blockers and calcium channel blockers. He's not had any episodes of chest pain or shortness of Breath  February 01, 2012: Stephen Mendoza is doing very well from a cardiac standpoint. He's not had any episodes of chest pain or shortness breath. He has developed some keloid formation at the lower aspect of his sternotomy.  July 25, 2012:  Stephen Mendoza is doing well. He is here with his wife [name]. He just reached his 1 year anniversary of his heart surgery. He has some irritation of his sternotomy scar.   Feb. 11, 2105:  Stephen Mendoza is doing well. Has continued to eat a low fat diet and is losing weight. Still does not want to take a statin. For fasting lipids and lipomed profile today. No complaints.  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  No issues with his myasthemia Gravis   Feb. 16, 2016  Stephen Mendoza is a 77 y.o. male who presents for follow up of his AVR, myomectomy.  He had an echo following his last office visit and his valve looks great.  He has trace AI.  He is concerned with  his mildly elevated BP.   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   Jun 01, 2014: Stephen Mendoza is doing well.  BP is generally better on the Valsartan.  No CP, breathing active.   Jun 04, 2015:   Stephen Mendoza and [name] are seen today . BP has been a bit high. 140s/70s range.  We discussed adding amlodipine .  He wants to try to get the BP down himself.   There is som concern that it might exacerbate his myasthenia .   Is tolerating the Zetia   Is having some night sweats.   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   Is having some issues with Orthostatic hypotension .  Advised increasing water intake  The Valsartan HCTZ is exacerbating this   Nov. 28, 2017: Stephen Mendoza is doing well. BP is a bit high. Has not been elevated at home  - typically 130 / 60  No CP or dyspnea.   Very busy on the farm.    Has lost 20 lbs .  He stopped the Zetia .   Was too expensive.   Sept. 4, 2018:  Stephen Mendoza is doing well.    BP has been elevated .   Was on Valsartan, was switched to [facility] -  did not work for him. Tried Olmarsartan 40 mg a day  - BP was still elevated.  Has tried HCTZ  - he had requested that he stop the HCTZ.  BP has been high since stopping the [facility]   November 01, 2017: Seen today for follow-up of his hypertension, coronary artery disease and aortic valve replacement.  He has a history of hyperlipidemia. 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   Has cut back on his Olmasartan to 10 mg a day    Past Medical History:  Diagnosis Date  . Aortic regurgitation   . Aortic stenosis    severe  . Benign prostatic hypertrophy (BPH) with nocturia   . Coronary artery disease   . GERD (gastroesophageal reflux disease)   . Hypertension    Essential  . Left atrial enlargement   .  Mitral regurgitation    Mild moderate  . Myasthenia gravis (HCC) 09/21/2011  . Palpitations   . Polio osteopathy of lower leg (HCC)   . PONV (postoperative nausea and vomiting)   . S/P aortic valve replacement with bioprosthetic valve 07/22/2011   21mm [facility] Ease pericardial tissue valve  . 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 Shortness of breath     Past Surgical History:  Procedure Laterality Date  . AORTIC VALVE REPLACEMENT  07/22/2011  . CARDIAC CATHETERIZATION    . CATARACT EXTRACTION    . CORONARY ARTERY BYPASS GRAFT  07/22/2011     Current Outpatient Medications  Medication Sig Dispense Refill  . aspirin 81 MG tablet Take 40.5 mg by mouth daily.     . Cholecalciferol (VITAMIN D3) 2000 units TABS Take 1 tablet by mouth 2 (two) times daily.    . hydrochlorothiazide (HYDRODIURIL) 25 MG tablet Take 1 tablet (25 mg total) by mouth daily. 90 tablet 3  . Multiple Vitamins-Minerals (MULTIVITAMIN WITH MINERALS) tablet Take 1 tablet by mouth daily.    Marland Kitchen olmesartan (BENICAR) 20 MG tablet Take 10 mg by mouth daily.    . Omega-3 Fatty Acids (FISH OIL) 1000 MG CAPS Take 1 capsule by mouth daily.    . potassium chloride (K-DUR) 10 MEQ tablet Take 1 tablet (10 mEq total) by mouth daily. 90 tablet 3  . pyridOXINE (VITAMIN B-6) 100 MG tablet Take 100 mg by mouth daily.    . vitamin C (ASCORBIC ACID) 500 MG tablet Take 500 mg by mouth 2 (two) times daily.    . NON FORMULARY Take 1 Dose by mouth daily. L-Citrulline 750 mg     No current facility-administered medications for this visit.     Allergies:   Patient has no known allergies.    Social History:  The patient  reports that he quit smoking about 47 years ago. His smoking use included cigarettes. He has never used smokeless tobacco. He reports that he does not drink alcohol or use drugs.   Family History:  The patient's family history includes Heart attack in his father.    ROS:  Please see  the history of present illness.   Physical Exam: Blood pressure (!) 150/62, pulse 65, height 5\' 8"  (1.727 m), weight 156 lb (70.8 kg), SpO2 97 %.  GEN:   Elder ly man, NAD  HEENT: Normal NECK: No JVD;  + carotid bruits vs. Radiation of aortic murmur  LYMPHATICS: No lymphadenopathy CARDIAC:  RR ,  2/6 systolic murmur  RESPIRATORY:   Clear  ABDOMEN: Soft, non-tender, non-distended MUSCULOSKELETAL:   No edema   SKIN: Warm and dry NEUROLOGIC:  Alert and oriented x 3   EKG:       Recent Labs: No results found for requested labs within last 8760 hours.    Lipid Panel    Component Value Date/Time   CHOL 249 (H) 12/10/2015 1147   CHOL 231 (H) 02/28/2014 0858   TRIG 43 12/10/2015 1147   TRIG 49 02/28/2014 0858   HDL 90 12/10/2015 1147   HDL 84 02/28/2014 0858   CHOLHDL 2.8 12/10/2015 1147   VLDL 9 12/10/2015 1147   LDLCALC 150 (H) 12/10/2015 1147   LDLCALC 137 (H) 02/28/2014 0858   LDLDIRECT 140.3 09/15/2011 1149      Wt Readings from Last 3 Encounters:  11/01/17 156 lb (70.8 kg)  09/15/16 154 lb 12.8 oz (70.2 kg)  12/10/15 151 lb 1.9 oz (68.5 kg)     Other studies Reviewed: Additional studies/ records that were reviewed today include: . Review of the above records demonstrates:    ASSESSMENT AND PLAN:  1. Aortic stenosis- status post aortic valve replacement (21mm [facility] Ease pericardial tissue valve), myomectomy, and coronary artery bypass grafting ( LIMA to LAD and SVG to PDA) No stenosis   2. Coronary artery disease -     No angina   3. Hypertension -   BP readings at home are normal .   .  4. Myasthenia gravis - has resolved.   5. Hyperlipidemia:    Refuses to take statins or PCSK-9 inhibitor     Will see him in 1 year   Current medicines are reviewed at length with the patient today.  The patient does not have concerns regarding medicines.  The following changes have been made:    Signed, Kristeen Miss, MD  11/01/2017 2:17 PM    [facility]  [facility] [address] [place], [place], [place]  [address] Phone: [phone]; Fax: [phone]

## 2017-11-01 NOTE — Patient Instructions (Signed)
Medication Instructions:  Your physician recommends that you continue on your current medications as directed. Please refer to the Current Medication list given to you today.  If you need a refill on your cardiac medications before your next appointment, please call your pharmacy.   Lab work: TODAY - cholesterol, liver panel, basic metabolic panel  If you have labs (blood work) drawn today and your tests are completely normal, you will receive your results only by: Marland Kitchen MyChart Message (if you have MyChart) OR . A paper copy in the mail If you have any lab test that is abnormal or we need to change your treatment, we will call you to review the results.  Testing/Procedures: None Ordered   Follow-Up: At [facility], you and your health needs are our priority.  As part of our continuing mission to provide you with exceptional heart care, we have created designated Provider Care Teams.  These Care Teams include your primary Cardiologist (physician) and Advanced Practice Providers (APPs -  Physician Assistants and Nurse Practitioners) who all work together to provide you with the care you need, when you need it. You will need a follow up appointment in:  12 months.  Please call our office 2 months in advance to schedule this appointment.  You may see No primary care provider on file. Dr. Elease Hashimoto or one of the following Advanced Practice Providers on your designated Care Team: Tereso Newcomer, PA-C Vin Nectar, [place] . Berton Bon, NP

## 2017-11-02 LAB — HEPATIC FUNCTION PANEL
ALT: 16 IU/L (ref 0–44)
AST: 23 IU/L (ref 0–40)
Albumin: 4.8 g/dL (ref 3.5–4.8)
Alkaline Phosphatase: 54 IU/L (ref 39–117)
Bilirubin Total: 0.4 mg/dL (ref 0.0–1.2)
Bilirubin, Direct: 0.14 mg/dL (ref 0.00–0.40)
TOTAL PROTEIN: 7 g/dL (ref 6.0–8.5)

## 2017-11-02 LAB — LIPID PANEL
CHOL/HDL RATIO: 3.4 ratio (ref 0.0–5.0)
Cholesterol, Total: 261 mg/dL — ABNORMAL HIGH (ref 100–199)
HDL: 77 mg/dL (ref >39)
LDL CALC: 173 mg/dL — AB (ref 0–99)
TRIGLYCERIDES: 54 mg/dL (ref 0–149)
VLDL Cholesterol Cal: 11 mg/dL (ref 5–40)

## 2017-11-02 LAB — BASIC METABOLIC PANEL
BUN / CREAT RATIO: 16 (ref 10–24)
BUN: 12 mg/dL (ref 8–27)
CHLORIDE: 99 mmol/L (ref 96–106)
CO2: 26 mmol/L (ref 20–29)
Calcium: 10.1 mg/dL (ref 8.6–10.2)
Creatinine, Ser: 0.76 mg/dL (ref 0.76–1.27)
GFR calc non Af Amer: 90 mL/min/{1.73_m2} (ref >59)
GFR, EST AFRICAN AMERICAN: 104 mL/min/{1.73_m2} (ref >59)
GLUCOSE: 85 mg/dL (ref 65–99)
POTASSIUM: 3.8 mmol/L (ref 3.5–5.2)
Sodium: 143 mmol/L (ref 134–144)

## 2019-06-06 ENCOUNTER — Ambulatory Visit: Payer: Medicare Other | Admitting: Cardiovascular Disease

## 2021-02-18 ENCOUNTER — Telehealth: Payer: Self-pay | Admitting: Cardiovascular Disease

## 2021-02-18 NOTE — Telephone Encounter (Signed)
Patient would like to speak to nurse about some things he has going on. Wouldn't go into further detail.

## 2021-02-18 NOTE — Telephone Encounter (Signed)
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 Only occurs on exertion-climbing hills on the farm, cutting firewood with chainsaw, etc. Pt states that the feelings go away within 5 minutes of rest. Denies chest pain, n/v, lethargy, confusion. Pt condones increased constipation lately, but not sure if it's in conjunction with SOB episodes. States no new changes in daily routine. States BP has been "very good" and stopped taking his Benicar and HCTZ two weeks ago. Last night's reading was 138/52. Pt states he weighs himself once weekly and has lost weight, no edema noted. Scheduled to see Dr Elease Hashimoto here on 2/28

## 2021-02-21 NOTE — Telephone Encounter (Signed)
Nahser, Deloris Ping, MD  You 2 days ago   Lets have him repeat his echo  Hx of AVR in the past  He may benefit from taking an HCTZ and see if that helps his DOE  We could send a prescription for lasix 40 mg a day and Kdur 20 meq a day for 3 days to see if that helps   PN    Called patient with these recommendations. Pt states that he is no longer experiencing any SOB and not had any episodes since he last called. Pt states that he does not want to do an ECHO or make any medication changes at this time. He chooses to call back if symptoms recur. Will let MD know-no orders placed at this time.

## 2021-03-11 ENCOUNTER — Ambulatory Visit: Payer: Medicare Other | Admitting: Cardiovascular Disease

## 2022-02-25 ENCOUNTER — Ambulatory Visit: Payer: Medicare Other | Admitting: Podiatry

## 2022-02-25 ENCOUNTER — Encounter: Payer: Self-pay | Admitting: Podiatry

## 2022-02-25 DIAGNOSIS — M2041 Other hammer toe(s) (acquired), right foot: Secondary | ICD-10-CM | POA: Diagnosis not present

## 2022-02-25 DIAGNOSIS — L84 Corns and callosities: Secondary | ICD-10-CM

## 2022-02-25 DIAGNOSIS — G14 Postpolio syndrome: Secondary | ICD-10-CM

## 2022-02-25 DIAGNOSIS — M2042 Other hammer toe(s) (acquired), left foot: Secondary | ICD-10-CM | POA: Diagnosis not present

## 2022-02-25 NOTE — Progress Notes (Signed)
  Subjective:  Patient ID: Stephen Mendoza, male    DOB: 01/16/1940,   MRN: [id number]  Chief Complaint  Patient presents with   Callouses    Bilateral callus.     82 y.o. male presents for concern of bilateral callus on his feet. Patient relates pain in both feet. 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 States he has tried filing the calluses himself but they return also relates concern of hammertoes and deformities in his feet . Denies any other pedal complaints. Denies n/v/f/c.   Past Medical History:  Diagnosis Date   Aortic regurgitation    Aortic stenosis    severe   Benign prostatic hypertrophy (BPH) with nocturia    Coronary artery disease    GERD (gastroesophageal reflux disease)    Hypertension    Essential   Left atrial enlargement    Mitral regurgitation    Mild moderate   Myasthenia gravis ([place]) 09/21/2011   Palpitations    Polio osteopathy of lower leg (HCC)    PONV (postoperative nausea and vomiting)    S/P aortic valve replacement with bioprosthetic valve 07/22/2011   62mm [facility] Ease pericardial tissue valve   S/P CABG x 2 07/22/2011   LIMA to LAD, SVG to PDA, EVH from right thigh   Shortness of breath     Objective:  Physical Exam: Vascular: DP/PT pulses 2/4 bilateral. CFT <3 seconds. Normal hair growth on digits. No edema.  Skin. No lacerations or abrasions bilateral feet. Hyperkeratotic lesions noted to distal right second toe and sub fifth metatarsal head bilateral Musculoskeletal: MMT 5/5 bilateral lower extremities in DF, PF, Inversion and Eversion. Deceased ROM in DF of ankle joint. Cavus type feet noted bilateral with hammertoes noted 2-5 bilateral and hallux malleous on the right foot.  Neurological: Sensation intact to light touch.   Assessment:   1. Post-poliomyelitis syndrome   2. Hammer toes of both feet   3. Pre-ulcerative calluses      Plan:  Patient was evaluated and treated  and all questions answered. -Discussed hammertoes and pre-ulcerative calluses with patient and treatment options.  -Hyperkeratotic tissue was debrided with chisel without incident as courtesy today.  -Encouraged daily moisturizing -Discussed use of pumice stone -Advised good supportive shoes and inserts. Discussed potential need for custom orthotics.  -Educated on hammertoes and treatment options  -Discussed padding including toe caps and crest pads.  -Return in 2 months to see how padding is helping and for paring of calluses as concern for ulceration.    Lorenda Peck, DPM

## 2022-03-10 ENCOUNTER — Telehealth: Payer: Self-pay | Admitting: Podiatry

## 2022-03-10 NOTE — Telephone Encounter (Signed)
Pt states his insurance needs a prior authorization for more frequent visits to have his RFC due to the calluses.  Pt spoke with [facility] and they need the auth sent from Dr Blenda Mounts to get a possible approval.  Also pt states he had Polio when he was 82 years old and one leg is shorter than the other and that's causing the callus on his foot by his walk deformity. He wanted to share this information to use for the possible authorization.  [place] [phone]

## 2022-03-12 NOTE — Telephone Encounter (Signed)
If there is a form to be filled out we can fill that out for him. I'm also not sure yet how often he would need to come I have only seen him once and seeing him next after two months which will help determine how often he may need to come.

## 2022-04-27 ENCOUNTER — Ambulatory Visit: Payer: Medicare Other | Admitting: Podiatry

## 2022-04-29 ENCOUNTER — Encounter: Payer: Self-pay | Admitting: Podiatry

## 2022-04-29 ENCOUNTER — Ambulatory Visit: Payer: Medicare Other | Admitting: Podiatry

## 2022-04-29 DIAGNOSIS — G14 Postpolio syndrome: Secondary | ICD-10-CM

## 2022-04-29 DIAGNOSIS — M2042 Other hammer toe(s) (acquired), left foot: Secondary | ICD-10-CM

## 2022-04-29 DIAGNOSIS — M2041 Other hammer toe(s) (acquired), right foot: Secondary | ICD-10-CM

## 2022-04-29 DIAGNOSIS — L84 Corns and callosities: Secondary | ICD-10-CM | POA: Diagnosis not present

## 2022-04-29 NOTE — Progress Notes (Signed)
  Subjective:  Patient ID: Stephen Mendoza, male    DOB: 1940-12-14,   MRN: [id number]  Chief Complaint  Patient presents with   Follow-up    Bilateral feet, patient stated he still has pain in the ball of foot due to callouse     82 y.o. male presents for follow-up of bilateral callus on his feet. Patient relates pain in both feet. 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 States he has tried filing the calluses himself but they return also relates concern of hammertoes and deformities in his feet . Denies any other pedal complaints. Denies n/v/f/c.   Past Medical History:  Diagnosis Date   Aortic regurgitation    Aortic stenosis    severe   Benign prostatic hypertrophy (BPH) with nocturia    Coronary artery disease    GERD (gastroesophageal reflux disease)    Hypertension    Essential   Left atrial enlargement    Mitral regurgitation    Mild moderate   Myasthenia gravis 09/21/2011   Palpitations    Polio osteopathy of lower leg    PONV (postoperative nausea and vomiting)    S/P aortic valve replacement with bioprosthetic valve 07/22/2011   21mm [facility] Magna Ease pericardial tissue valve   S/P CABG x 2 07/22/2011   LIMA to LAD, SVG to PDA, EVH from right thigh   Shortness of breath     Objective:  Physical Exam: Vascular: DP/PT pulses 2/4 bilateral. CFT <3 seconds. Normal hair growth on digits. No edema.  Skin. No lacerations or abrasions bilateral feet. Hyperkeratotic lesions noted to distal right second toe and sub fifth metatarsal head bilateral Musculoskeletal: MMT 5/5 bilateral lower extremities in DF, PF, Inversion and Eversion. Deceased ROM in DF of ankle joint. Cavus type feet noted bilateral with hammertoes noted 2-5 bilateral and hallux malleous on the right foot.  Neurological: Sensation intact to light touch.   Assessment:   1. Pre-ulcerative calluses   2. Hammer toes of both feet   3. Post-poliomyelitis  syndrome       Plan:  Patient was evaluated and treated and all questions answered. -Discussed hammertoes and pre-ulcerative calluses with patient and treatment options.  -Hyperkeratotic tissue was debrided with chisel without incident as courtesy today.  -Encouraged daily moisturizing -Discussed use of pumice stone -Advised good supportive shoes and inserts. Discussed potential need for custom orthotics.  -Discussed these calluses are coming form the deformities he developed from post polio syndrome.  -Educated on hammertoes and treatment options  -Discussed padding including toe caps and crest pads.  -Return in 2 months for rfc.    Louann Sjogren, DPM

## 2022-05-26 ENCOUNTER — Ambulatory Visit: Payer: Medicare Other | Admitting: Podiatry

## 2022-07-30 ENCOUNTER — Ambulatory Visit: Payer: Medicare Other | Admitting: Podiatry

## 2022-09-16 ENCOUNTER — Ambulatory Visit: Payer: Medicare Other | Admitting: Podiatry

## 2022-09-16 DIAGNOSIS — M21961 Unspecified acquired deformity of right lower leg: Secondary | ICD-10-CM

## 2022-09-16 DIAGNOSIS — M21962 Unspecified acquired deformity of left lower leg: Secondary | ICD-10-CM

## 2022-09-16 DIAGNOSIS — L84 Corns and callosities: Secondary | ICD-10-CM

## 2022-09-16 NOTE — Progress Notes (Signed)
Subjective:  Patient ID: Stephen Mendoza, male    DOB: [birth year],  MRN: [id number]  DERRIUS RUDESILL presents to clinic today for:  Chief Complaint  Patient presents with   Callouses    Callus trim to bilateral feet.    Patient presents with c/o painful calluses to the bottom of both feet.  States this is a recurring issue.    PCP is Judge Stall, MD.  Past Medical History:  Diagnosis Date   Aortic regurgitation    Aortic stenosis    severe   Benign prostatic hypertrophy (BPH) with nocturia    Coronary artery disease    GERD (gastroesophageal reflux disease)    Hypertension    Essential   Left atrial enlargement    Mitral regurgitation    Mild moderate   Myasthenia gravis (HCC) 09/21/2011   Palpitations    Polio osteopathy of lower leg (HCC)    PONV (postoperative nausea and vomiting)    S/P aortic valve replacement with bioprosthetic valve 07/22/2011   21mm [facility] Magna Ease pericardial tissue valve   S/P CABG x 2 07/22/2011   LIMA to LAD, SVG to PDA, EVH from right thigh   Shortness of breath     Past Surgical History:  Procedure Laterality Date   AORTIC VALVE REPLACEMENT  07/22/2011   CARDIAC CATHETERIZATION     CATARACT EXTRACTION     CORONARY ARTERY BYPASS GRAFT  07/22/2011   No Known Allergies  Objective:  There were no vitals filed for this visit.  Stephen Mendoza is a pleasant 82 y.o. male in NAD. AAO x 3.  Vascular Examination: Capillary refill time is 3-5 seconds to toes bilateral. Palpable pedal pulses b/l LE. Digital hair present b/l. No pedal edema b/l. Skin temperature gradient WNL b/l. No varicosities b/l. No cyanosis or clubbing noted b/l.   Dermatological Examination: Pedal skin with normal turgor, texture and tone b/l. No open wounds. No interdigital macerations b/l.   Hyperkeratotic lesion present, with pain on palpation, located submet 5 bilateral .  Neurological Examination: Protective sensation intact with Semmes-Weinstein 10  gram monofilament b/l LE.   Musculoskeletal Examination: Muscle strength 5/5 to all LE muscle groups b/l. Bony prominence on plantar aspect of 5th metatarsal head bilateral.   Assessment/Plan: 1. Pre-ulcerative calluses   2. Metatarsal deformity, left   3. Metatarsal deformity, right    The hyperkeratotic lesions were sharply debrided/shaved with sterile #313 blade.  Discussed with the patient what is causing the corns/calluses and reviewed treatment options today, including shaving the the painful lesion(s), off-loading techniques and pads, custom orthotics / shoe modifications.  If conservative treatment fails to provide enough relief, or if the frequency of recurrence increases, then will recommend surgical intervention to help prevent the painful skin lesions from recurring.   Off-loading pads applied to the underside of his shoe insole to off-load area.  Recommended Revitaderm 40 cream for callus prevention.   Return in about 4 months (around 01/16/2023) for callus.   Clerance Lav, DPM, FACFAS [facility]     [address] [place], [place] [address]                Office [phone]  Fax [phone]

## 2023-01-20 ENCOUNTER — Ambulatory Visit: Payer: Medicare Other | Admitting: Podiatry

## 2023-01-20 DIAGNOSIS — I739 Peripheral vascular disease, unspecified: Secondary | ICD-10-CM | POA: Diagnosis not present

## 2023-01-20 DIAGNOSIS — L84 Corns and callosities: Secondary | ICD-10-CM

## 2023-01-20 NOTE — Progress Notes (Signed)
 Subjective:  Patient ID: Stephen Mendoza, male    DOB: [birth year],  MRN: [id number]  Stephen Mendoza presents to clinic today for:  Chief Complaint  Patient presents with   Callouses    Callouses bilateral, not diabetic, Asa   Patient presents with concern of painful calluses on the plantar aspect of the forefoot bilateral.  He comes in usually every 3 to 4 months to have these shaved.  He sometimes is able to work on them at home but is unable to really see them very well due to the location.  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  He is wearing sneakers today.  PCP is Lennie Boom, MD. last seen around 11/25/2022  Past Medical History:  Diagnosis Date   Aortic regurgitation    Aortic stenosis    severe   Benign prostatic hypertrophy (BPH) with nocturia    Coronary artery disease    GERD (gastroesophageal reflux disease)    Hypertension    Essential   Left atrial enlargement    Mitral regurgitation    Mild moderate   Myasthenia gravis (HCC) 09/21/2011   Palpitations    Polio osteopathy of lower leg (HCC)    PONV (postoperative nausea and vomiting)    S/P aortic valve replacement with bioprosthetic valve 07/22/2011   21mm [facility] Magna Ease pericardial tissue valve   S/P CABG x 2 07/22/2011   LIMA to LAD, SVG to PDA, EVH from right thigh   Shortness of breath     Past Surgical History:  Procedure Laterality Date   AORTIC VALVE REPLACEMENT  07/22/2011   CARDIAC CATHETERIZATION     CATARACT EXTRACTION     CORONARY ARTERY BYPASS GRAFT  07/22/2011    No Known Allergies  Objective:  Stephen Mendoza is a pleasant 83 y.o. male in NAD. AAO x 3.  Vascular Examination: Capillary refill time is 3-5 seconds to toes bilateral. Palpable pedal pulses b/l LE. Digital hair present b/l. No pedal edema b/l. Skin temperature gradient WNL b/l. No varicosities b/l. No cyanosis or clubbing noted b/l.   Dermatological Examination: Pedal skin with  normal turgor, texture and tone b/l. No open wounds. No interdigital macerations b/l.   Hyperkeratotic lesion present, with pain on palpation, located bilateral submet 5  Musculoskeletal Examination: Dorsiflexed position of the fifth toe bilateral with resultant plantarflexed fifth metatarsal bilateral  Assessment/Plan: 1. Pre-ulcerative calluses   2. PVD (peripheral vascular disease) (HCC)    The hyperkeratotic lesions x2 were sharply debrided/shaved with sterile #313 blade.  The felt offloading pads were replaced in his shoes today.  He can bring other shoes at his future appointments and we can place these offloading pads and all the shoes as needed.  As long as the insole is removable we can work with that.  Discussed with the patient what is causing the corns/calluses and reviewed treatment options today, including shaving the the painful lesion(s), off-loading techniques and pads, custom orthotics / shoe modifications.   Return in about 3 months (around 04/20/2023) for calluses.   Awanda CHARM Imperial, DPM, FACFAS [facility]     [address], [place] [address]  Office [phone]  Fax [phone]

## 2023-04-22 ENCOUNTER — Ambulatory Visit: Payer: Medicare Other | Admitting: Podiatry

## 2023-06-10 ENCOUNTER — Ambulatory Visit: Admitting: Podiatry
